# Patient Record
Sex: Male | Born: 2000 | Race: White | Hispanic: No | Marital: Single | Smoking: Current some day smoker
Health system: Southern US, Community
[De-identification: ages and names within clinical notes are randomized; demographics above are authoritative.]

## PROBLEM LIST (undated history)

## (undated) DIAGNOSIS — C801 Malignant (primary) neoplasm, unspecified: Secondary | ICD-10-CM

---
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
## 2019-04-09 ENCOUNTER — Other Ambulatory Visit: Payer: Self-pay

## 2019-04-09 ENCOUNTER — Emergency Department (HOSPITAL_COMMUNITY)
Admission: EM | Admit: 2019-04-09 | Discharge: 2019-04-09 | Disposition: A | Discharge status: Home / Self Care | Payer: Medicaid Other | Attending: Emergency Medicine | Admitting: Emergency Medicine

## 2019-04-09 ENCOUNTER — Encounter (HOSPITAL_COMMUNITY): Payer: Self-pay | Admitting: Emergency Medicine

## 2019-04-09 ENCOUNTER — Emergency Department (HOSPITAL_COMMUNITY): Payer: Medicaid Other

## 2019-04-09 DIAGNOSIS — K5900 Constipation, unspecified: Secondary | ICD-10-CM | POA: Diagnosis not present

## 2019-04-09 DIAGNOSIS — F17290 Nicotine dependence, other tobacco product, uncomplicated: Secondary | ICD-10-CM | POA: Insufficient documentation

## 2019-04-09 DIAGNOSIS — R1033 Periumbilical pain: Secondary | ICD-10-CM | POA: Insufficient documentation

## 2019-04-09 LAB — URINALYSIS, ROUTINE W REFLEX MICROSCOPIC
Bilirubin Urine: NEGATIVE
Glucose, UA: NEGATIVE mg/dL
Hgb urine dipstick: NEGATIVE
Ketones, ur: NEGATIVE mg/dL
Leukocytes,Ua: NEGATIVE
Nitrite: NEGATIVE
Protein, ur: NEGATIVE mg/dL
Specific Gravity, Urine: >1.03 — ABNORMAL HIGH (ref 1.005–1.030)
pH: 6 (ref 5.0–8.0)

## 2019-04-09 LAB — CBC
HCT: 44.9 % (ref 39.0–52.0)
Hemoglobin: 14.9 g/dL (ref 13.0–17.0)
MCH: 28.8 pg (ref 26.0–34.0)
MCHC: 33.2 g/dL (ref 30.0–36.0)
MCV: 86.8 fL (ref 80.0–100.0)
Platelets: 177 10*3/uL (ref 150–400)
RBC: 5.17 MIL/uL (ref 4.22–5.81)
RDW: 12.1 % (ref 11.5–15.5)
WBC: 4.2 10*3/uL (ref 4.0–10.5)
nRBC: 0 % (ref 0.0–0.2)

## 2019-04-09 LAB — COMPREHENSIVE METABOLIC PANEL
ALT: 12 U/L (ref 0–44)
AST: 20 U/L (ref 15–41)
Albumin: 4.4 g/dL (ref 3.5–5.0)
Alkaline Phosphatase: 76 U/L (ref 38–126)
Anion gap: 12 (ref 5–15)
BUN: 7 mg/dL (ref 6–20)
CO2: 24 mmol/L (ref 22–32)
Calcium: 9.6 mg/dL (ref 8.9–10.3)
Chloride: 104 mmol/L (ref 98–111)
Creatinine, Ser: 0.87 mg/dL (ref 0.61–1.24)
GFR calc Af Amer: >60 mL/min (ref >60)
GFR calc non Af Amer: >60 mL/min (ref >60)
Glucose, Bld: 97 mg/dL (ref 70–99)
Potassium: 4 mmol/L (ref 3.5–5.1)
Sodium: 140 mmol/L (ref 135–145)
Total Bilirubin: 0.8 mg/dL (ref 0.3–1.2)
Total Protein: 6.5 g/dL (ref 6.5–8.1)

## 2019-04-09 LAB — LIPASE, BLOOD: Lipase: 20 U/L (ref 11–51)

## 2019-04-09 MED ORDER — MORPHINE SULFATE (PF) 4 MG/ML IV SOLN
4.0000 mg | Freq: Once | INTRAVENOUS | Status: AC
  Administered 2019-04-09: 4 mg via INTRAVENOUS
  Filled 2019-04-09: qty 1

## 2019-04-09 MED ORDER — IOHEXOL 300 MG/ML  SOLN
100.0000 mL | Freq: Once | INTRAMUSCULAR | Status: AC | PRN
  Administered 2019-04-09: 100 mL via INTRAVENOUS

## 2019-04-09 MED ORDER — SODIUM CHLORIDE 0.9 % IV BOLUS
1000.0000 mL | Freq: Once | INTRAVENOUS | Status: AC
  Administered 2019-04-09: 1000 mL via INTRAVENOUS

## 2019-04-09 MED ORDER — ONDANSETRON HCL 4 MG/2ML IJ SOLN
4.0000 mg | Freq: Once | INTRAMUSCULAR | Status: AC
  Administered 2019-04-09: 4 mg via INTRAVENOUS
  Filled 2019-04-09: qty 2

## 2019-04-09 MED ORDER — SODIUM CHLORIDE 0.9% FLUSH
3.0000 mL | Freq: Once | INTRAVENOUS | Status: AC
  Administered 2019-04-09: 3 mL via INTRAVENOUS

## 2019-04-09 NOTE — Discharge Instructions (Addendum)
Your CT today did not show any appendicitis.   Suspect your pain is being caused due to constipation, please purchase Miralax over the counter and drink 1 capful in 8 oz of water for the next few days until you produce a bowel movement.   Follow up with your primary care physician .

## 2019-04-09 NOTE — ED Triage Notes (Signed)
Pt reports 1 week hx of periumbilical abdominal pain. Denies recent sick contacts. Medical problems. Fever. Reports recent emesis, constipation.

## 2019-04-09 NOTE — ED Provider Notes (Signed)
[facility] EMERGENCY DEPARTMENT Provider Note   CSN: [id number] Arrival date & time: 04/09/19  9030     History Chief Complaint  Patient presents with  . Abdominal Pain    Nicholas Dunn is a 19 y.o. male.  19 y.o male with a PMH of Bipolar presents to the ED with a chief complaint of abdominal pain x < 1 week. Patient describes an ache, dull pain on the periumbilical area, worse with attempting to defecate. Pain is alleviated with applying pressure around his umbilical region. He does endorse mild constipation reporting his last bowel movement was 4 days ago, no blood in his stool. He has not tried medication for improvement for his symptoms. He denies any alcohol use, drug use or THC use. No prior surgical history to his abdomen, fever, N/V/D. No recent travel or sick conctatcs.   The history is provided by the patient.  Abdominal Pain Pain location:  Periumbilical Associated symptoms: constipation   Associated symptoms: no chest pain, no diarrhea, no dysuria, no fever, no nausea, no shortness of breath, no sore throat and no vomiting         Family History  Problem Relation Age of Onset  . Healthy Mother   . Healthy Father     Social History   Tobacco Use  . Smoking status: Current Some Day Smoker    Types: Cigars  . Smokeless tobacco: Never Used  Substance Use Topics  . Alcohol use: Not Currently  . Drug use: Not Currently    Home Medications Prior to Admission medications   Medication Sig Start Date End Date Taking? Authorizing Provider  albuterol (VENTOLIN HFA) 108 (90 Base) MCG/ACT inhaler Inhale 1-2 puffs into the lungs every 6 (six) hours as needed for wheezing or shortness of breath. 12/18/18   Mickie Bail, NP  benzonatate (TESSALON) 100 MG capsule Take 1 capsule (100 mg total) by mouth every 8 (eight) hours. 09/14/18   Janace Aris, NP    Allergies    Patient has no known allergies.  Review of Systems   Review of Systems   Constitutional: Negative for fever.  HENT: Negative for sore throat.   Respiratory: Negative for shortness of breath.   Cardiovascular: Negative for chest pain.  Gastrointestinal: Positive for abdominal pain and constipation. Negative for blood in stool, diarrhea, nausea and vomiting.  Genitourinary: Negative for dysuria, flank pain, scrotal swelling and testicular pain.  Musculoskeletal: Negative for back pain.  Skin: Negative for pallor and wound.  Neurological: Negative for light-headedness and headaches.    Physical Exam Updated Vital Signs BP 115/62   Pulse (!) 49   Temp 97.9 F (36.6 C) (Oral)   Resp 20   Ht 6' (1.829 m)   Wt 59 kg   SpO2 100%   BMI 17.63 kg/m   Physical Exam Vitals and nursing note reviewed.  Constitutional:      Appearance: He is well-developed.  HENT:     Head: Normocephalic and atraumatic.  Cardiovascular:     Rate and Rhythm: Normal rate.  Pulmonary:     Effort: Pulmonary effort is normal.     Breath sounds: No wheezing or rales.     Comments: Lungs are cleared to auscultation, without wheezing, rales or rhonchi.  Abdominal:     General: Abdomen is flat. Bowel sounds are normal. There is no distension. There are no signs of injury.     Palpations: Abdomen is rigid. There is no hepatomegaly or splenomegaly.  Tenderness: There is generalized abdominal tenderness and tenderness in the periumbilical area and suprapubic area. There is guarding and rebound. There is no right CVA tenderness or left CVA tenderness. Positive signs include obturator sign. Negative signs include Murphy's sign.     Hernia: No hernia is present.  Skin:    General: Skin is warm and dry.  Neurological:     Mental Status: He is alert and oriented to person, place, and time.     ED Results / Procedures / Treatments   Labs (all labs ordered are listed, but only abnormal results are displayed) Labs Reviewed  URINALYSIS, ROUTINE W REFLEX MICROSCOPIC - Abnormal; Notable  for the following components:      Result Value   Specific Gravity, Urine >1.030 (*)    All other components within normal limits  LIPASE, BLOOD  COMPREHENSIVE METABOLIC PANEL  CBC    EKG None  Radiology CT ABDOMEN PELVIS W CONTRAST  Result Date: 04/09/2019 CLINICAL DATA:  Periumbilical abdominal pain for the past week. Nausea and vomiting. EXAM: CT ABDOMEN AND PELVIS WITH CONTRAST TECHNIQUE: Multidetector CT imaging of the abdomen and pelvis was performed using the standard protocol following bolus administration of intravenous contrast. CONTRAST:  122mL OMNIPAQUE IOHEXOL 300 MG/ML  SOLN COMPARISON:  None. FINDINGS: Lower chest: No acute abnormality. Hepatobiliary: No focal liver abnormality is seen. No gallstones, gallbladder wall thickening, or biliary dilatation. Pancreas: Unremarkable. No pancreatic ductal dilatation or surrounding inflammatory changes. Spleen: Normal in size without focal abnormality. Adrenals/Urinary Tract: Adrenal glands are unremarkable. Kidneys are normal, without renal calculi, focal lesion, or hydronephrosis. Bladder is unremarkable for the degree of distention. Stomach/Bowel: Stomach is within normal limits. Appendix appears normal. No evidence of bowel wall thickening, distention, or inflammatory changes. 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 No enlarged abdominal or pelvic lymph nodes. Reproductive: Prostate is unremarkable. Other: Trace free fluid in the pelvis. No pneumoperitoneum. Musculoskeletal: No acute or significant osseous findings. IMPRESSION: 1. No acute intra-abdominal process. Normal appendix. 2. Trace free fluid in the pelvis, nonspecific. 3. 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 Electronically Signed   By: Titus Dubin M.D.   On: 04/09/2019 10:41    Procedures Procedures (including critical care time)  Medications Ordered in  ED Medications  sodium chloride flush (NS) 0.9 % injection 3 mL (3 mLs Intravenous Given 04/09/19 0904)  morphine 4 MG/ML injection 4 mg (4 mg Intravenous Given 04/09/19 0852)  ondansetron (ZOFRAN) injection 4 mg (4 mg Intravenous Given 04/09/19 0852)  sodium chloride 0.9 % bolus 1,000 mL (0 mLs Intravenous Stopped 04/09/19 1211)  iohexol (OMNIPAQUE) 300 MG/ML solution 100 mL (100 mLs Intravenous Contrast Given 04/09/19 1020)  morphine 4 MG/ML injection 4 mg (4 mg Intravenous Given 04/09/19 1147)    ED Course  I have reviewed the triage vital signs and the nursing notes.  Pertinent labs & imaging results that were available during my care of the patient were reviewed by me and considered in my medical decision making (see chart for details).    MDM Rules/Calculators/A&P   Patient with a past medical history aside from bipolar presents to the ED with complaints of umbilical pain for the past 1 week.  Describing it as a dull aching pain to the periumbilical area alleviated with pressure on it.  Does report he is somewhat constipated with the last bowel movement 4 days ago.  No  prior surgical history, no alcohol use, drug use, THC use.  No fevers or recent suspicious food intake.  During my evaluation patient appears very uncomfortable, heart rate slightly elevated at 90, he has not taken any medication for improvement in symptoms.  Abdomen is somewhat rigid, he is voluntarily guarding, does have a positive obturator sign, bowel sounds are present, no visualized hernia.  Some suspicion for appendicitis versus constipation.  Given Zofran, morphine, bolus for symptomatic control.  He denies any testicular swelling, ocular pain, penile discharge, lower suspicion for GU involvement.   CT abdomen: 1. No acute intra-abdominal process. Normal appendix.  2. Trace free fluid in the pelvis, nonspecific.  3. 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     12:41 PM Patient's pain is controlled, did receive 2 doses of morphine which helped his symptoms.  He is requesting food at this time, will p.o. challenge, with dispo home   Portions of this note were generated with Dragon dictation software. Dictation errors may occur despite best attempts at proofreading.  Final Clinical Impression(s) / ED Diagnoses Final diagnoses:  Periumbilical abdominal pain    Rx / DC Orders ED Discharge Orders    None       Claude Manges, PA-C 04/09/19 1251    Gwyneth Sprout, MD 04/09/19 [phone]

## 2019-04-09 NOTE — ED Notes (Signed)
Pt tolerating PO intake well.

## 2019-07-04 ENCOUNTER — Other Ambulatory Visit: Payer: Self-pay

## 2019-07-04 ENCOUNTER — Ambulatory Visit (HOSPITAL_COMMUNITY)
Admission: EM | Admit: 2019-07-04 | Discharge: 2019-07-04 | Disposition: A | Discharge status: Home / Self Care | Payer: Medicaid Other | Attending: Family Medicine | Admitting: Family Medicine

## 2019-07-04 ENCOUNTER — Encounter (HOSPITAL_COMMUNITY): Payer: Self-pay

## 2019-07-04 DIAGNOSIS — L259 Unspecified contact dermatitis, unspecified cause: Secondary | ICD-10-CM | POA: Diagnosis not present

## 2019-07-04 MED ORDER — PREDNISONE 10 MG (21) PO TBPK: ORAL_TABLET | Freq: Every day | ORAL | 0 refills | Status: DC

## 2019-07-04 NOTE — ED Triage Notes (Signed)
Pt reports itchy rash in arms x 5 days after he was working in the garden.  Pt states the rash can be insect bites. Pt have no tried any medication.

## 2019-07-07 NOTE — ED Provider Notes (Signed)
  [facility]   [id number] 07/04/19 Arrival Time: 1857  ASSESSMENT & PLAN:  1. Contact dermatitis, unspecified contact dermatitis type, unspecified trigger     Possibly rhus. Discussed. No sign of skin infection.  Meds ordered this encounter  Medications  . predniSONE (STERAPRED UNI-PAK 21 TAB) 10 MG (21) TBPK tablet    Sig: Take by mouth daily. Take as directed.    Dispense:  21 tablet    Refill:  0    Benadryl if needed.  Will follow up with PCP or here if worsening or failing to improve as anticipated. Reviewed expectations re: course of current medical issues. Questions answered. Outlined signs and symptoms indicating need for more acute intervention. Patient verbalized understanding. After Visit Summary given.   SUBJECTIVE:  Nicholas Dunn is a 19 y.o. male who presents with a skin complaint. Rash; arms; bilat; noted after working in garden; itching; afebrile; no pain. No specific aggravating or alleviating factors reported.     OBJECTIVE: Vitals:   07/04/19 2012  BP: 112/68  Pulse: (!) 55  Resp: 16  Temp: 98.2 F (36.8 C)  TempSrc: Oral  SpO2: 100%    General appearance: alert; no distress HEENT: [place]; AT Neck: supple with FROM Lungs: clear to auscultation bilaterally Heart: regular rate and rhythm Extremities: no edema; moves all extremities normally Skin: warm and dry; areas of linear papules and vesicles with surrounding erythema over bilateral UE Psychological: alert and cooperative; normal mood and affect  No Known Allergies  History reviewed. No pertinent past medical history. Social History   Socioeconomic History  . Marital status: Single    Spouse name: Not on file  . Number of children: Not on file  . Years of education: Not on file  . Highest education level: Not on file  Occupational History  . Not on file  Tobacco Use  . Smoking status: Current Some Day Smoker    Types: Cigars  . Smokeless tobacco: Never Used  Substance  and Sexual Activity  . Alcohol use: Not Currently  . Drug use: Not Currently  . Sexual activity: Not on file  Other Topics Concern  . Not on file  Social History Narrative  . Not on file   Social Determinants of Health   Financial Resource Strain:   . Difficulty of Paying Living Expenses:   Food Insecurity:   . Worried About Programme researcher, broadcasting/film/video in the Last Year:   . Barista in the Last Year:   Transportation Needs:   . Freight forwarder (Medical):   Marland Kitchen Lack of Transportation (Non-Medical):   Physical Activity:   . Days of Exercise per Week:   . Minutes of Exercise per Session:   Stress:   . Feeling of Stress :   Social Connections:   . Frequency of Communication with Friends and Family:   . Frequency of Social Gatherings with Friends and Family:   . Attends Religious Services:   . Active Member of Clubs or Organizations:   . Attends Banker Meetings:   Marland Kitchen Marital Status:   Intimate Partner Violence:   . Fear of Current or Ex-Partner:   . Emotionally Abused:   Marland Kitchen Physically Abused:   . Sexually Abused:    Family History  Problem Relation Age of Onset  . Healthy Mother   . Healthy Father    History reviewed. No pertinent surgical history.   Mardella Layman, MD 07/07/19 1002

## 2019-10-24 ENCOUNTER — Emergency Department (HOSPITAL_COMMUNITY)
Admission: EM | Admit: 2019-10-24 | Discharge: 2019-10-25 | Disposition: A | Discharge status: Home / Self Care | Payer: Medicaid Other | Attending: Emergency Medicine | Admitting: Emergency Medicine

## 2019-10-24 ENCOUNTER — Encounter (HOSPITAL_COMMUNITY): Payer: Self-pay | Admitting: Emergency Medicine

## 2019-10-24 ENCOUNTER — Other Ambulatory Visit: Payer: Self-pay

## 2019-10-24 DIAGNOSIS — Z5321 Procedure and treatment not carried out due to patient leaving prior to being seen by health care provider: Secondary | ICD-10-CM | POA: Insufficient documentation

## 2019-10-24 DIAGNOSIS — R197 Diarrhea, unspecified: Secondary | ICD-10-CM | POA: Diagnosis present

## 2019-10-24 DIAGNOSIS — R1030 Lower abdominal pain, unspecified: Secondary | ICD-10-CM | POA: Insufficient documentation

## 2019-10-24 LAB — COMPREHENSIVE METABOLIC PANEL
ALT: 16 U/L (ref 0–44)
AST: 25 U/L (ref 15–41)
Albumin: 3.7 g/dL (ref 3.5–5.0)
Alkaline Phosphatase: 66 U/L (ref 38–126)
Anion gap: 9 (ref 5–15)
BUN: 8 mg/dL (ref 6–20)
CO2: 25 mmol/L (ref 22–32)
Calcium: 9 mg/dL (ref 8.9–10.3)
Chloride: 104 mmol/L (ref 98–111)
Creatinine, Ser: 0.88 mg/dL (ref 0.61–1.24)
GFR calc Af Amer: >60 mL/min (ref >60)
GFR calc non Af Amer: >60 mL/min (ref >60)
Glucose, Bld: 86 mg/dL (ref 70–99)
Potassium: 4.1 mmol/L (ref 3.5–5.1)
Sodium: 138 mmol/L (ref 135–145)
Total Bilirubin: 0.7 mg/dL (ref 0.3–1.2)
Total Protein: 6.4 g/dL — ABNORMAL LOW (ref 6.5–8.1)

## 2019-10-24 LAB — CBC
HCT: 43.7 % (ref 39.0–52.0)
Hemoglobin: 14.3 g/dL (ref 13.0–17.0)
MCH: 28.7 pg (ref 26.0–34.0)
MCHC: 32.7 g/dL (ref 30.0–36.0)
MCV: 87.8 fL (ref 80.0–100.0)
Platelets: 208 10*3/uL (ref 150–400)
RBC: 4.98 MIL/uL (ref 4.22–5.81)
RDW: 12.1 % (ref 11.5–15.5)
WBC: 6.5 10*3/uL (ref 4.0–10.5)
nRBC: 0 % (ref 0.0–0.2)

## 2019-10-24 LAB — LIPASE, BLOOD: Lipase: 25 U/L (ref 11–51)

## 2019-10-24 NOTE — ED Triage Notes (Signed)
Patient arrives to ED with complaints of diarrhea since Friday and suprapubic abdominal pain. Pt states he has gone 7-8 times a day. Today, pt noticed bright red blood coming from his rectum and abdominal pain increasing. Pt states he got two turtles on the same day the diarrhea started, he was wondering if the turtles shell had bacteria in them that could make him ill.

## 2019-10-25 NOTE — ED Notes (Addendum)
Pt called multiple times for roll call with out response.

## 2020-03-26 ENCOUNTER — Encounter (HOSPITAL_COMMUNITY): Payer: Self-pay

## 2020-03-26 ENCOUNTER — Emergency Department (HOSPITAL_COMMUNITY)
Admission: EM | Admit: 2020-03-26 | Discharge: 2020-03-26 | Disposition: A | Discharge status: Home / Self Care | Payer: Medicaid Other | Attending: Emergency Medicine | Admitting: Emergency Medicine

## 2020-03-26 DIAGNOSIS — J029 Acute pharyngitis, unspecified: Secondary | ICD-10-CM

## 2020-03-26 DIAGNOSIS — R509 Fever, unspecified: Secondary | ICD-10-CM | POA: Diagnosis present

## 2020-03-26 DIAGNOSIS — F17290 Nicotine dependence, other tobacco product, uncomplicated: Secondary | ICD-10-CM | POA: Diagnosis not present

## 2020-03-26 DIAGNOSIS — J45909 Unspecified asthma, uncomplicated: Secondary | ICD-10-CM | POA: Diagnosis not present

## 2020-03-26 DIAGNOSIS — Z20822 Contact with and (suspected) exposure to covid-19: Secondary | ICD-10-CM | POA: Diagnosis not present

## 2020-03-26 LAB — GROUP A STREP BY PCR: Group A Strep by PCR: NOT DETECTED

## 2020-03-26 LAB — SARS CORONAVIRUS 2 (TAT 6-24 HRS): SARS Coronavirus 2: NEGATIVE

## 2020-03-26 MED ORDER — IBUPROFEN 400 MG PO TABS
600.0000 mg | ORAL_TABLET | Freq: Once | ORAL | Status: AC
  Administered 2020-03-26: 600 mg via ORAL
  Filled 2020-03-26: qty 1

## 2020-03-26 NOTE — ED Triage Notes (Signed)
Pt reports that he has had fevers at homes, headache and sore throat since yesterday, unvaccinated

## 2020-03-26 NOTE — ED Provider Notes (Signed)
[facility] EMERGENCY DEPARTMENT Provider Note   CSN: [id number] Arrival date & time: 03/26/20  5465     History Chief Complaint  Patient presents with  . Covid Exposure    EDUARD PENKALA is a 20 y.o. male.  HPI  Pt presenting with c/o fever, headache, sore throat- symptoms beginning last night.  He has no significant cough or dificulty breathing.  He has hx of asthma.  No difficulty swallowing.  Headache is frontal.  tmax 101 at home- he took tylenol and symptoms continued this morning.  No vomiting or abdominal pain.  He states hx of asthma- no problems with it in approx 1 year.  He states he does have albuterol inhaler at home.  No known covid exposures.  Has not had covid vaccination. There are no other associated systemic symptoms, there are no other alleviating or modifying factors.      History reviewed. No pertinent past medical history.  There are no problems to display for this patient.   History reviewed. No pertinent surgical history.     Family History  Problem Relation Age of Onset  . Healthy Mother   . Healthy Father     Social History   Tobacco Use  . Smoking status: Current Some Day Smoker    Types: Cigars  . Smokeless tobacco: Never Used  Vaping Use  . Vaping Use: Every day  . Substances: Nicotine, Flavoring  Substance Use Topics  . Alcohol use: Not Currently  . Drug use: Not Currently    Home Medications Prior to Admission medications   Medication Sig Start Date End Date Taking? Authorizing Provider  albuterol (VENTOLIN HFA) 108 (90 Base) MCG/ACT inhaler Inhale 1-2 puffs into the lungs every 6 (six) hours as needed for wheezing or shortness of breath. 12/18/18   Mickie Bail, NP  benzonatate (TESSALON) 100 MG capsule Take 1 capsule (100 mg total) by mouth every 8 (eight) hours. 09/14/18   Bast, Gloris Manchester A, NP  predniSONE (STERAPRED UNI-PAK 21 TAB) 10 MG (21) TBPK tablet Take by mouth daily. Take as directed. 07/04/19   Mardella Layman, MD    Allergies    Patient has no known allergies.  Review of Systems   Review of Systems  ROS reviewed and all otherwise negative except for mentioned in HPI  Physical Exam Updated Vital Signs BP 117/70   Pulse 66   Temp 98.2 F (36.8 C) (Oral)   Resp 15   SpO2 100%  Vitals reviewed Physical Exam  Physical Examination: GENERAL ASSESSMENT: active, alert, no acute distress, well hydrated, well nourished SKIN: no lesions, jaundice, petechiae, pallor, cyanosis, ecchymosis HEAD: Atraumatic, normocephalic MOUTH: mucous membranes moist, OP with moderate erythema, palate symmetric, uvula midline, no exudate NECK: supple, full range of motion, no mass, no sig LAD LUNGS: Respiratory effort normal, clear to auscultation, normal breath sounds bilaterally HEART: Regular rate and rhythm, normal S1/S2, no murmurs, normal pulses and brisk capillary fill EXTREMITY: Normal muscle tone. No swelling NEURO: normal tone, awake, alert, interactive  ED Results / Procedures / Treatments   Labs (all labs ordered are listed, but only abnormal results are displayed) Labs Reviewed  GROUP A STREP BY PCR  SARS CORONAVIRUS 2 (TAT 6-24 HRS)    EKG None  Radiology No results found.  Procedures Procedures   Medications Ordered in ED Medications  ibuprofen (ADVIL) tablet 600 mg (600 mg Oral Given 03/26/20 6812)    ED Course  I have reviewed the triage vital  signs and the nursing notes.  Pertinent labs & imaging results that were available during my care of the patient were reviewed by me and considered in my medical decision making (see chart for details).    MDM Rules/Calculators/A&P                          Pt presenting with c/o sore throat, headache, fever beginning yesterday.   Patient is overall nontoxic and well hydrated in appearance.  Strep testing negative, no signs of PTA.  Lungs clear with normal work of breathing, no hypoxia.  covid test pending.  Advised symptomatic  treatment at home.  Isolation until covid test result obtained.  Pt endorses he does have albuterol inhaler- no wheezing currently- for home use.  Pt discharged with strict return precautions.  Mom agreeable with plan  Final Clinical Impression(s) / ED Diagnoses Final diagnoses:  Viral pharyngitis    Rx / DC Orders ED Discharge Orders    None       Tayt Moyers, Latanya Maudlin, MD 03/26/20 1017

## 2020-03-26 NOTE — Discharge Instructions (Signed)
Return to the ED with any concerns including difficulty breathing, vomiting and not able to keep down liquids, decreased urine output, decreased level of alertness/lethargy, or any other alarming symptoms  °

## 2020-03-31 ENCOUNTER — Other Ambulatory Visit: Payer: Self-pay

## 2020-03-31 ENCOUNTER — Encounter (HOSPITAL_COMMUNITY): Payer: Self-pay | Admitting: Emergency Medicine

## 2020-03-31 ENCOUNTER — Ambulatory Visit (HOSPITAL_COMMUNITY)
Admission: EM | Admit: 2020-03-31 | Discharge: 2020-03-31 | Disposition: A | Discharge status: Home / Self Care | Payer: Medicaid Other | Attending: Emergency Medicine | Admitting: Emergency Medicine

## 2020-03-31 DIAGNOSIS — J069 Acute upper respiratory infection, unspecified: Secondary | ICD-10-CM

## 2020-03-31 MED ORDER — AEROCHAMBER PLUS MISC: 2 refills | Status: DC

## 2020-03-31 MED ORDER — FAMOTIDINE 20 MG PO TABS: 20.0000 mg | ORAL_TABLET | Freq: Two times a day (BID) | ORAL | 0 refills | Status: DC

## 2020-03-31 MED ORDER — BENZONATATE 200 MG PO CAPS: 200.0000 mg | ORAL_CAPSULE | Freq: Three times a day (TID) | ORAL | 0 refills | Status: DC | PRN

## 2020-03-31 MED ORDER — ALBUTEROL SULFATE HFA 108 (90 BASE) MCG/ACT IN AERS
1.0000 | INHALATION_SPRAY | RESPIRATORY_TRACT | 0 refills | Status: DC | PRN
Start: 2020-03-31 — End: 2021-05-15

## 2020-03-31 MED ORDER — FLUTICASONE PROPIONATE 50 MCG/ACT NA SUSP: 2.0000 | Freq: Every day | NASAL | 0 refills | Status: DC

## 2020-03-31 NOTE — ED Triage Notes (Addendum)
Patient started with cough on Thursday.  Patient reports going to ED for treatment.  Patient was given ibuprofen.  Patient has a cough and sore throat.  Denies fever  03/26/2020-strep and covid tested negative

## 2020-03-31 NOTE — Discharge Instructions (Addendum)
Tessalon for the cough.  Flonase, saline nasal irrigation with a Lloyd Huger Med rinse and distilled water as often as he wants.  Continue Mucinex D or Mucinex DM.  2 puffs from your albuterol inhaler using your spacer every 4-6 hours as needed.  You can also try some Pepcid in case your cough is being aggravated by acid reflux.  Follow-up with a primary care provider of your choice, see list below  Below is a list of primary care practices who are taking new patients for you to follow-up with.  [facility] internal medicine clinic Ground Floor - [facility], [address] [place], [place], [place] [address] [phone]  [facility] at [facility] [address] [place], [place] [address] [phone]  [facility] [address] [place], [place] [address] [phone]  [facility] [phone] [address] [place] [address]  [facility]: [address] [place] [address]  [phone]  [facility]: [address] [place] [address]   [phone]  [facility]: [address] [place] [address]  [phone]  [facility] : [address] [place] [address] [phone]  [facility] Primary Care: [address] [place] [address] [phone]  [facility]: [address] [place] [address] [phone]  Dr. Oneal Grout [address] [facility] [place] [facility] [place] [address]  [phone]  Dr. Jackie Plum, [facility]. [address] [place], [place] [address]  [phone]  Go to www.goodrx.com to look up your medications. This will give you a list of where you can find your prescriptions at the most affordable prices. Or ask  the pharmacist what the cash price is, or if they have any other discount programs available to help make your medication more affordable. This can be less expensive than what you would pay with insurance.

## 2020-03-31 NOTE — ED Provider Notes (Signed)
HPI  SUBJECTIVE:  Nicholas Dunn is a 20 y.o. male who presents with 5 days of cough occasionally productive of greenish mucus, sore throat, chest soreness secondary to the cough.  He denies fevers, nasal congestion, rhinorrhea, postnasal drip, wheezing, shortness of breath, dyspnea on exertion.  No sinus pain or pressure, nausea, vomiting, diarrhea.  He reports burning chest pain when he eats or drinks acidic foods.  He denies waterbrash or belching.  No allergy symptoms.  States he is unable to sleep at night due to the cough.  He is waking up coughing.  He has not tried his albuterol inhaler during this time.  No antibiotics in the past month.  No antipyretic in the past 6 hours.  He has tried 500 mg of Tylenol, Mucinex DM, ibuprofen and Chloraseptic spray.  The Chloraseptic and Mucinex helped.  Symptoms are worse with lying down.  He has a past medical history of asthma that he states is controlled with albuterol as needed.  No history of GERD, allergies, frequent sinusitis, smoking.  PMD: None.  Seen in the ED 5 days ago for fever, headache, sore throat.  Strep, COVID were negative.    History reviewed. No pertinent past medical history.  History reviewed. No pertinent surgical history.  Family History  Problem Relation Age of Onset  . Healthy Mother   . Healthy Father     Social History   Tobacco Use  . Smoking status: Current Some Day Smoker    Types: Cigars  . Smokeless tobacco: Never Used  Vaping Use  . Vaping Use: Every day  . Substances: Nicotine, Flavoring  Substance Use Topics  . Alcohol use: Not Currently  . Drug use: Not Currently    No current facility-administered medications for this encounter.  Current Outpatient Medications:  .  acetaminophen (TYLENOL) 325 MG tablet, Take 650 mg by mouth every 6 (six) hours as needed., Disp: , Rfl:  .  albuterol (VENTOLIN HFA) 108 (90 Base) MCG/ACT inhaler, Inhale 1-2 puffs into the lungs every 4 (four) hours as needed for  wheezing or shortness of breath., Disp: 1 each, Rfl: 0 .  benzonatate (TESSALON) 200 MG capsule, Take 1 capsule (200 mg total) by mouth 3 (three) times daily as needed for cough., Disp: 30 capsule, Rfl: 0 .  famotidine (PEPCID) 20 MG tablet, Take 1 tablet (20 mg total) by mouth 2 (two) times daily., Disp: 60 tablet, Rfl: 0 .  fluticasone (FLONASE) 50 MCG/ACT nasal spray, Place 2 sprays into both nostrils daily., Disp: 16 g, Rfl: 0 .  guaiFENesin (MUCINEX) 600 MG 12 hr tablet, Take by mouth 2 (two) times daily., Disp: , Rfl:  .  NON FORMULARY, Throat spray, Disp: , Rfl:  .  Spacer/Aero-Holding Chambers (AEROCHAMBER PLUS) inhaler, Use with inhaler, Disp: 1 each, Rfl: 2  No Known Allergies   ROS  As noted in HPI.   Physical Exam  BP (!) 147/84 (BP Location: Right Arm)   Pulse 86   Temp 98.4 F (36.9 C) (Oral)   Resp (!) 22   SpO2 96%   Constitutional: Well developed, well nourished, no acute distress.  Coughing. Eyes:  EOMI, conjunctiva normal bilaterally HENT: Normocephalic, atraumatic,mucus membranes moist. positive nasal congestion.  No maxillary, frontal sinus tenderness.  Positive cobblestoning and postnasal drip Respiratory: Normal inspiratory effort, lungs clear bilaterally.  No anterior, lateral chest wall tenderness Cardiovascular: Normal rate, regular rhythm no murmurs rubs or gallops GI: nondistended skin: No rash, skin intact Musculoskeletal: no deformities Neurologic:  Alert & oriented x 3, no focal neuro deficits Psychiatric: Speech and behavior appropriate   ED Course   Medications - No data to display  No orders of the defined types were placed in this encounter.   No results found for this or any previous visit (from the past 24 hour(s)). No results found.  ED Clinical Impression  1. Viral URI with cough      ED Assessment/Plan  ER notes reviewed.  Notes noted in HPI.  Suspect the cough to be coming from a URI/postnasal drip.  Could also be a mild  asthma exacerbation, however denies wheezing, shortness of breath, shortness of breath with exertion. he is also reporting symptoms that are suspicious for acid reflux which could be contributing to his cough.  His lungs are clear, afebrile, no antipyretic in the past 6 hours, pneumonia lower in the differential.  No evidence of sinusitis.  Will send home with Tessalon, Flonase, saline nasal irrigation, continue Mucinex DM.  Will prescribe an albuterol inhaler with a spacer.  He is to do 2 puffs every 4 hours as needed.  Also will start him on some Pepcid.  Will provide primary care list for ongoing care, and order primary care referral.   Discussed  MDM, treatment plan, and plan for follow-up with patient. Discussed sn/sx that should prompt return to the ED. patient agrees with plan.   Meds ordered this encounter  Medications  . albuterol (VENTOLIN HFA) 108 (90 Base) MCG/ACT inhaler    Sig: Inhale 1-2 puffs into the lungs every 4 (four) hours as needed for wheezing or shortness of breath.    Dispense:  1 each    Refill:  0  . fluticasone (FLONASE) 50 MCG/ACT nasal spray    Sig: Place 2 sprays into both nostrils daily.    Dispense:  16 g    Refill:  0  . Spacer/Aero-Holding Chambers (AEROCHAMBER PLUS) inhaler    Sig: Use with inhaler    Dispense:  1 each    Refill:  2    Please educate patient on use  . benzonatate (TESSALON) 200 MG capsule    Sig: Take 1 capsule (200 mg total) by mouth 3 (three) times daily as needed for cough.    Dispense:  30 capsule    Refill:  0  . famotidine (PEPCID) 20 MG tablet    Sig: Take 1 tablet (20 mg total) by mouth 2 (two) times daily.    Dispense:  60 tablet    Refill:  0    *This clinic note was created using Scientist, clinical (histocompatibility and immunogenetics). Therefore, there may be occasional mistakes despite careful proofreading.   ?    Domenick Gong, MD 03/31/20 [phone]

## 2020-11-30 ENCOUNTER — Emergency Department
Admission: EM | Admit: 2020-11-30 | Discharge: 2020-12-01 | Disposition: A | Discharge status: Home / Self Care | Payer: Medicaid Other | Attending: Emergency Medicine | Admitting: Emergency Medicine

## 2020-11-30 ENCOUNTER — Other Ambulatory Visit: Payer: Self-pay

## 2020-11-30 DIAGNOSIS — R45851 Suicidal ideations: Secondary | ICD-10-CM | POA: Diagnosis not present

## 2020-11-30 DIAGNOSIS — F4323 Adjustment disorder with mixed anxiety and depressed mood: Secondary | ICD-10-CM

## 2020-11-30 DIAGNOSIS — Z20822 Contact with and (suspected) exposure to covid-19: Secondary | ICD-10-CM | POA: Diagnosis not present

## 2020-11-30 DIAGNOSIS — Z046 Encounter for general psychiatric examination, requested by authority: Secondary | ICD-10-CM | POA: Diagnosis not present

## 2020-11-30 DIAGNOSIS — F17290 Nicotine dependence, other tobacco product, uncomplicated: Secondary | ICD-10-CM | POA: Diagnosis not present

## 2020-11-30 DIAGNOSIS — Y900 Blood alcohol level of less than 20 mg/100 ml: Secondary | ICD-10-CM | POA: Insufficient documentation

## 2020-11-30 LAB — CBC
HCT: 42.7 % (ref 39.0–52.0)
Hemoglobin: 14.8 g/dL (ref 13.0–17.0)
MCH: 29.9 pg (ref 26.0–34.0)
MCHC: 34.7 g/dL (ref 30.0–36.0)
MCV: 86.3 fL (ref 80.0–100.0)
Platelets: 194 10*3/uL (ref 150–400)
RBC: 4.95 MIL/uL (ref 4.22–5.81)
RDW: 12.3 % (ref 11.5–15.5)
WBC: 7.7 10*3/uL (ref 4.0–10.5)
nRBC: 0 % (ref 0.0–0.2)

## 2020-11-30 LAB — RESP PANEL BY RT-PCR (FLU A&B, COVID) ARPGX2
Influenza A by PCR: NEGATIVE
Influenza B by PCR: NEGATIVE
SARS Coronavirus 2 by RT PCR: NEGATIVE

## 2020-11-30 LAB — ETHANOL: Alcohol, Ethyl (B): <10 mg/dL (ref <10)

## 2020-11-30 LAB — COMPREHENSIVE METABOLIC PANEL
ALT: 15 U/L (ref 0–44)
AST: 26 U/L (ref 15–41)
Albumin: 4.5 g/dL (ref 3.5–5.0)
Alkaline Phosphatase: 75 U/L (ref 38–126)
Anion gap: 8 (ref 5–15)
BUN: 15 mg/dL (ref 6–20)
CO2: 27 mmol/L (ref 22–32)
Calcium: 9.4 mg/dL (ref 8.9–10.3)
Chloride: 103 mmol/L (ref 98–111)
Creatinine, Ser: 0.94 mg/dL (ref 0.61–1.24)
GFR, Estimated: >60 mL/min (ref >60)
Glucose, Bld: 112 mg/dL — ABNORMAL HIGH (ref 70–99)
Potassium: 3.8 mmol/L (ref 3.5–5.1)
Sodium: 138 mmol/L (ref 135–145)
Total Bilirubin: 0.9 mg/dL (ref 0.3–1.2)
Total Protein: 6.8 g/dL (ref 6.5–8.1)

## 2020-11-30 LAB — SALICYLATE LEVEL: Salicylate Lvl: <7 mg/dL — ABNORMAL LOW (ref 7.0–30.0)

## 2020-11-30 LAB — ACETAMINOPHEN LEVEL: Acetaminophen (Tylenol), Serum: <10 ug/mL — ABNORMAL LOW (ref 10–30)

## 2020-11-30 MED ORDER — ALBUTEROL SULFATE HFA 108 (90 BASE) MCG/ACT IN AERS
1.0000 | INHALATION_SPRAY | RESPIRATORY_TRACT | Status: DC | PRN
  Filled 2020-11-30: qty 6.7

## 2020-11-30 MED ORDER — FLUTICASONE PROPIONATE 50 MCG/ACT NA SUSP
2.0000 | Freq: Every day | NASAL | Status: DC
  Filled 2020-11-30: qty 16

## 2020-11-30 MED ORDER — FAMOTIDINE 20 MG PO TABS
20.0000 mg | ORAL_TABLET | Freq: Two times a day (BID) | ORAL | Status: DC
  Filled 2020-11-30: qty 1

## 2020-11-30 MED ORDER — NICOTINE 21 MG/24HR TD PT24
21.0000 mg | MEDICATED_PATCH | Freq: Every day | TRANSDERMAL | Status: DC
  Administered 2020-12-01: 21 mg via TRANSDERMAL
  Filled 2020-11-30: qty 1

## 2020-11-30 NOTE — ED Notes (Signed)
Report to include Situation, Background, Assessment, and Recommendations received from Dawn RN. Patient alert and oriented, warm and dry, in no acute distress. Patient denies SI, HI, AVH and pain. Patient made aware of Q15 minute rounds and security cameras for their safety. Patient instructed to come to me with needs or concerns.  

## 2020-11-30 NOTE — ED Triage Notes (Signed)
Pt states is suicidal. Presents voluntary. Pt states has felt this way for a day. Pt is cooperative in triage.

## 2020-11-30 NOTE — ED Notes (Signed)
Hourly rounding reveals patient in room. No complaints, stable, in no acute distress. Q15 minute rounds and monitoring via Security Cameras to continue. 

## 2020-11-30 NOTE — ED Notes (Signed)
Handcuff's removed as soon as patient placed into room 23.  Patient now calm and cooperative.

## 2020-11-30 NOTE — ED Notes (Addendum)
Patient ran into lobby, [facility] security bringing patient back to room 23.  Patient now in handcuff restraints by [facility] for patient's safety.  Dr. Fanny Bien in to speak with patient.

## 2020-11-30 NOTE — ED Notes (Signed)
ED Provider at bedside. 
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
## 2020-12-01 DIAGNOSIS — F4323 Adjustment disorder with mixed anxiety and depressed mood: Secondary | ICD-10-CM

## 2020-12-01 LAB — URINE DRUG SCREEN, QUALITATIVE (ARMC ONLY)
Amphetamines, Ur Screen: NOT DETECTED
Barbiturates, Ur Screen: NOT DETECTED
Benzodiazepine, Ur Scrn: NOT DETECTED
Cannabinoid 50 Ng, Ur ~~LOC~~: NOT DETECTED
Cocaine Metabolite,Ur ~~LOC~~: NOT DETECTED
MDMA (Ecstasy)Ur Screen: NOT DETECTED
Methadone Scn, Ur: NOT DETECTED
Opiate, Ur Screen: NOT DETECTED
Phencyclidine (PCP) Ur S: NOT DETECTED
Tricyclic, Ur Screen: NOT DETECTED

## 2020-12-01 MED ORDER — ONDANSETRON 4 MG PO TBDP
4.0000 mg | ORAL_TABLET | Freq: Once | ORAL | Status: AC | PRN
  Administered 2020-12-01: 4 mg via ORAL
  Filled 2020-12-01 (×2): qty 1

## 2020-12-01 MED ORDER — ONDANSETRON 4 MG PO TBDP
ORAL_TABLET | ORAL | Status: AC
  Administered 2020-12-01: 4 mg via ORAL
  Filled 2020-12-01: qty 1

## 2020-12-01 MED ORDER — LORAZEPAM 1 MG PO TABS
1.0000 mg | ORAL_TABLET | Freq: Once | ORAL | Status: AC
  Administered 2020-12-01: 1 mg via ORAL
  Filled 2020-12-01: qty 1

## 2020-12-01 MED ORDER — ONDANSETRON 4 MG PO TBDP
4.0000 mg | ORAL_TABLET | Freq: Once | ORAL | Status: AC
  Filled 2020-12-01: qty 1

## 2020-12-01 MED ORDER — ACETAMINOPHEN 325 MG PO TABS
650.0000 mg | ORAL_TABLET | Freq: Four times a day (QID) | ORAL | Status: DC | PRN
  Administered 2020-12-01: 650 mg via ORAL
  Filled 2020-12-01: qty 2

## 2020-12-01 NOTE — ED Notes (Signed)
Hourly rounding reveals patient in room. No complaints, stable, in no acute distress. Q15 minute rounds and monitoring via Security Cameras to continue. 

## 2020-12-01 NOTE — ED Notes (Signed)
VS not taken, patient asleep 

## 2020-12-01 NOTE — ED Notes (Signed)
Water and crackers were given.

## 2020-12-01 NOTE — ED Notes (Signed)
IVC/pending psych consult 

## 2020-12-01 NOTE — ED Notes (Signed)
Pt given zofran and ginger ale. Temperature rechecked. Pt now c/o HA. MD made aware.

## 2020-12-01 NOTE — ED Notes (Signed)
Pt given belongings post discharge instructions.  

## 2020-12-01 NOTE — ED Notes (Signed)
Snack and drink offered. Pt continues to feel nauseas.

## 2020-12-01 NOTE — ED Notes (Signed)
Patient vomited x2. Zofran ordered as protocol order. Dr. Roxan Hockey notified. Will repeat temp to verify no fever is present.

## 2020-12-01 NOTE — ED Provider Notes (Signed)
The patient has been evaluated at bedside by Dr. Toni Amend, psychiatry.  Patient is clinically stable.  Not felt to be a danger to self or others.  No SI or Hi.  No indication for inpatient psychiatric admission at this time.  Appropriate for continued outpatient therapy.    Willy Eddy, MD 12/01/20 [phone]

## 2020-12-01 NOTE — ED Notes (Addendum)
Lunch tray offered. Pt still feel nauseas. Water was given.

## 2020-12-01 NOTE — ED Notes (Signed)
Phone given. 

## 2020-12-01 NOTE — ED Notes (Signed)
Pt is asleep. VS will be assess when pt is awake. No other needs found at this moment. 

## 2020-12-01 NOTE — ED Notes (Signed)
Pt requested to provide visitor with his car key. This tech provided visitor with pt car key. Victorino Dike, RN aware.

## 2020-12-01 NOTE — ED Notes (Signed)
Patient now c/o HA as well. Awaiting Tylenol to be verified by pharmacy to give due to hard stop in [facility].

## 2020-12-01 NOTE — ED Notes (Signed)
Patient vomited again. Denies abd pain or dizziness. Dr. Roxan Hockey notified, Zofran given.

## 2020-12-01 NOTE — ED Notes (Addendum)
Pt in bathroom throwing up in the bathroom and endorses diarrhea. Full set of vitals taken at this time

## 2020-12-01 NOTE — ED Provider Notes (Signed)
Emergency Medicine Observation Re-evaluation Note  Nicholas Dunn is a 20 y.o. male, seen on rounds today.  Pt initially presented to the ED for complaints of Suicidal Currently, the patient is resting.  Physical Exam  BP 133/79   Pulse 70   Resp 16   Ht 6' (1.829 m)   Wt 63.5 kg   SpO2 96%   BMI 18.99 kg/m  Physical Exam General: NAD Cardiac: well perfused Lungs: unlabored Psych: currently calm  ED Course / MDM  EKG:   I have reviewed the labs performed to date as well as medications administered while in observation.  Recent changes in the last 24 hours include none.  Plan  Current plan is for psych eval. Nicholas Dunn is under involuntary commitment.      Willy Eddy, MD 12/01/20 [phone]

## 2020-12-01 NOTE — ED Notes (Signed)
Pt has a visitor. This staff remains outside the room.
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
## 2020-12-01 NOTE — ED Notes (Signed)
Breakfast tray given. VS obtained. Shower offered. No other needs found at this moment. 

## 2021-02-08 IMAGING — CT CT ABD-PELV W/ CM
2 of 4 series · 16 of 46 positions shown, 18 images · IV contrast (omnipaque)
Comparison: None.

CLINICAL DATA: Periumbilical abdominal pain for the past week.
Nausea and vomiting.

EXAM:
CT ABDOMEN AND PELVIS WITH CONTRAST
TECHNIQUE: Multidetector CT imaging of the abdomen and pelvis was performed
using the standard protocol following bolus administration of
intravenous contrast.
CONTRAST:  100mL OMNIPAQUE IOHEXOL 300 MG/ML  SOLN

[Series 3: abd/ pelvis 5.0 i30f 2 · axial · 0.62mm/px · z∈[+767,+1167]mm · 13 of 88 slices shown, 15 images]
[im 4/88  soft-tissue]
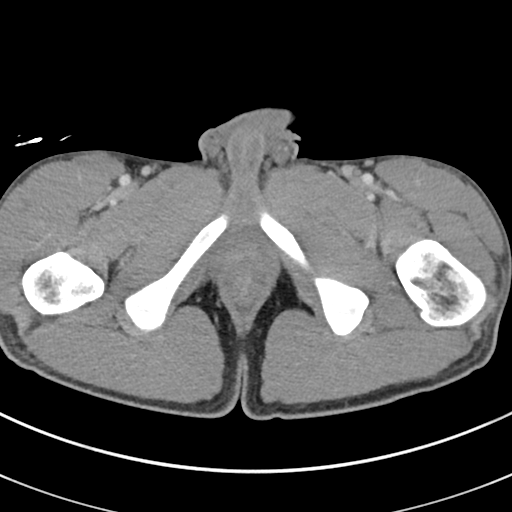
[im 4/88  bone]
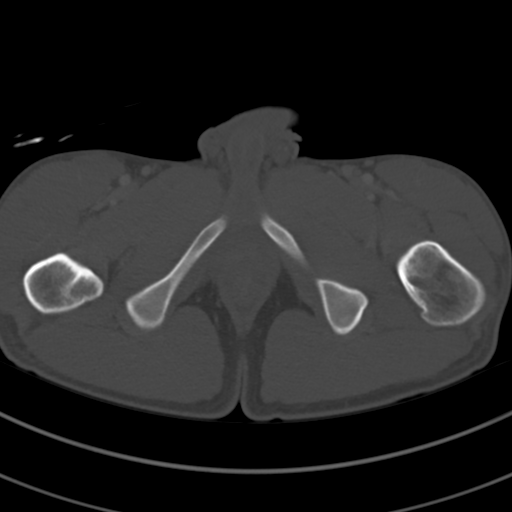
[im 11/88  soft-tissue]
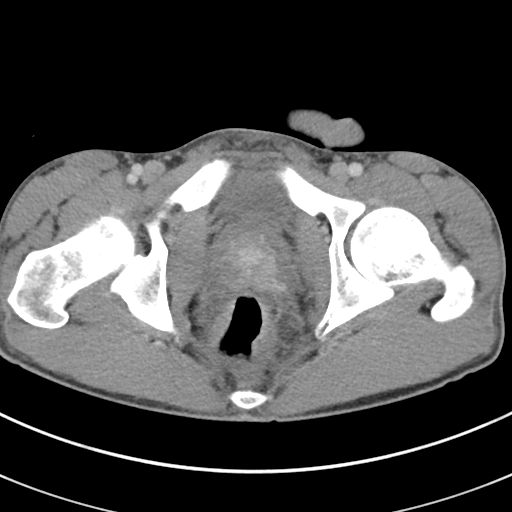
[im 18/88  soft-tissue]
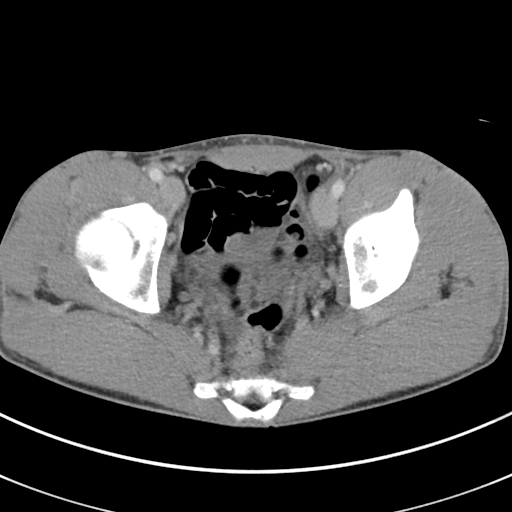
[im 25/88  soft-tissue]
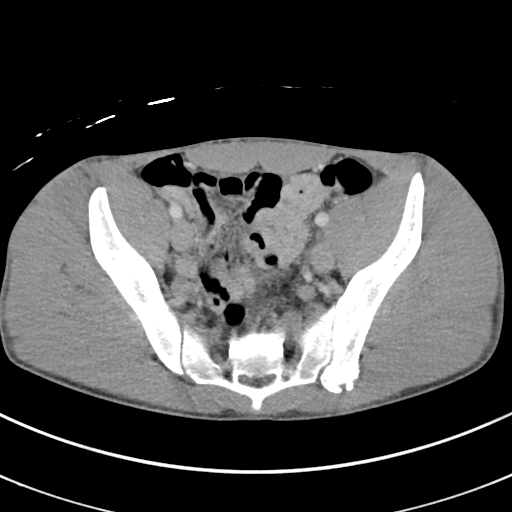
[im 32/88  soft-tissue]
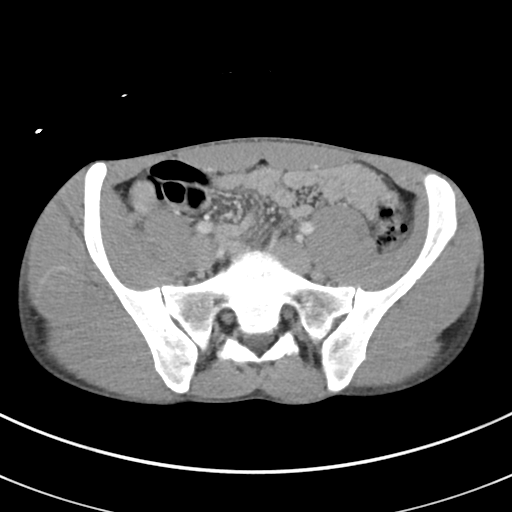
[im 39/88  soft-tissue]
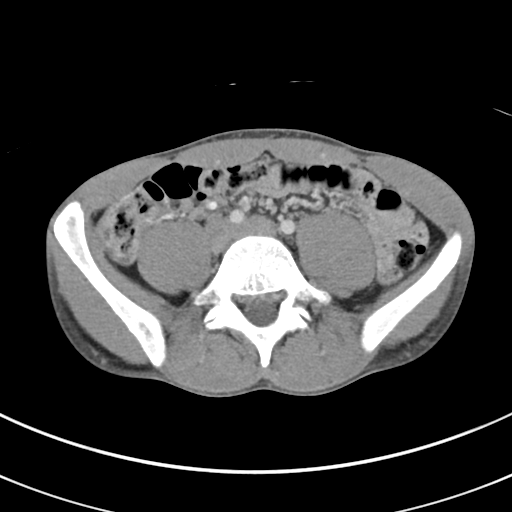
[im 46/88  soft-tissue]
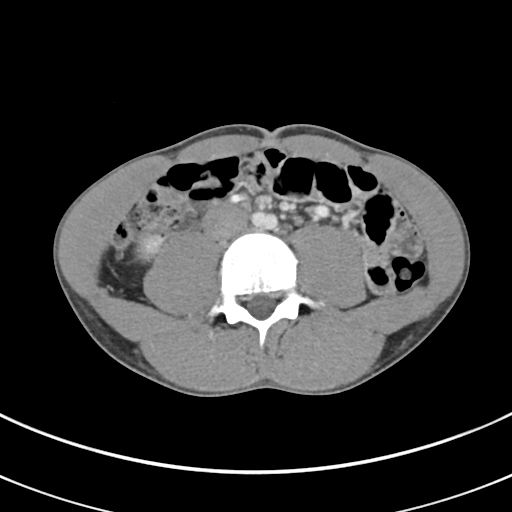
[im 49/88  soft-tissue]
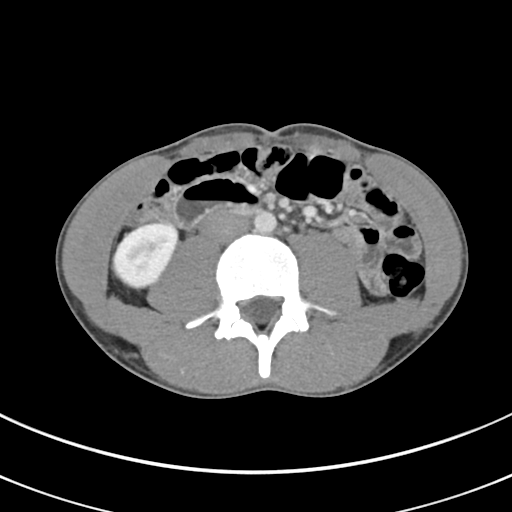
[im 56/88  soft-tissue]
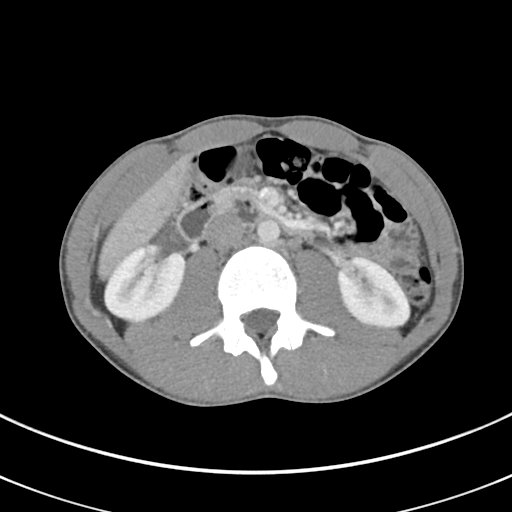
[im 56/88  bone]
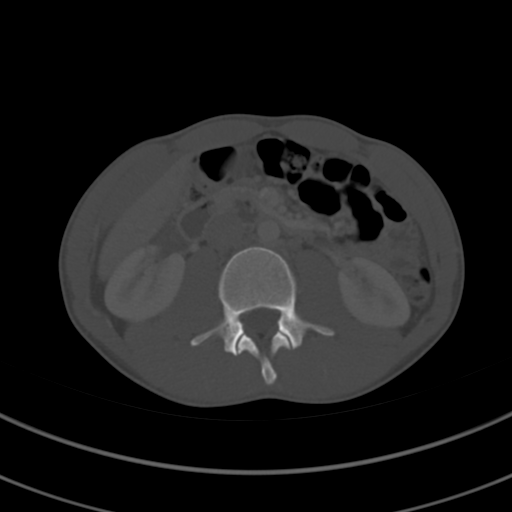
[im 63/88  soft-tissue]
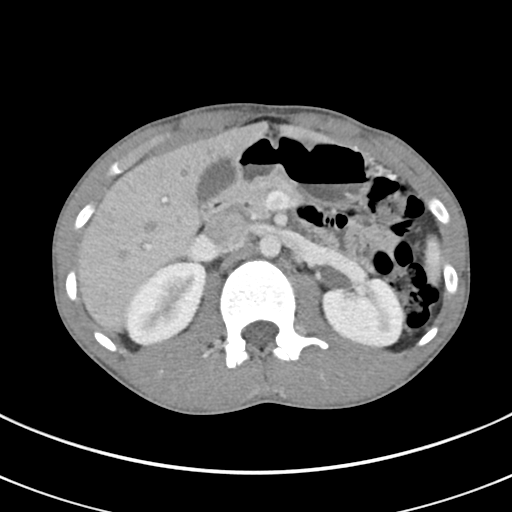
[im 70/88  soft-tissue]
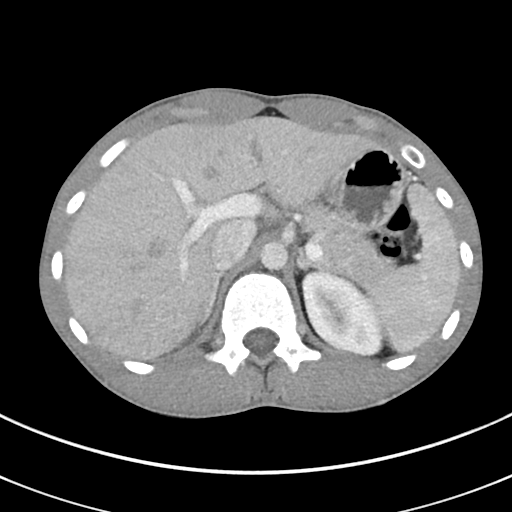
[im 77/88  soft-tissue]
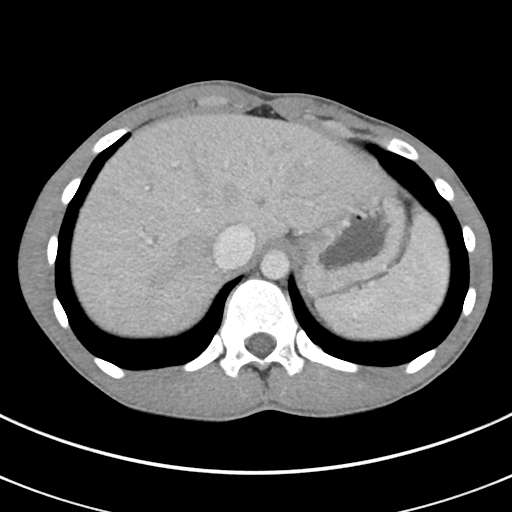
[im 84/88  soft-tissue]
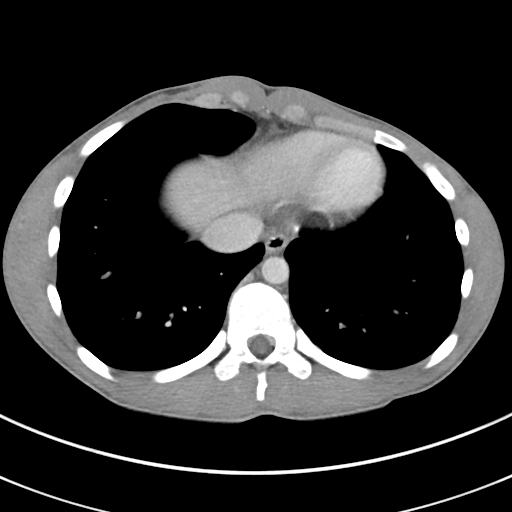

[Series 6: coronal soft tissue · coronal · 0.69mm/px · 3 of 83 slices shown]
[im 28/83  soft-tissue]
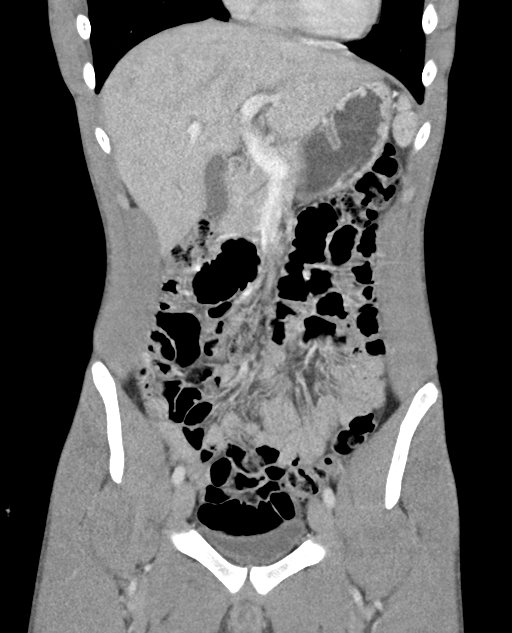
[im 37/83  soft-tissue]
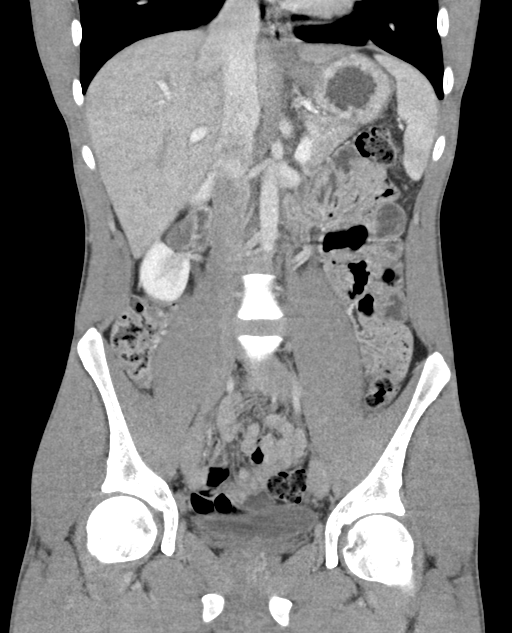
[im 46/83  soft-tissue]
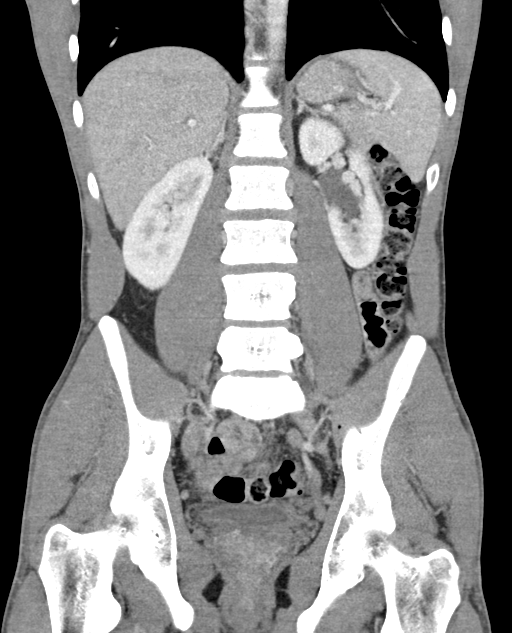

[16 of 46 positions shown; findings below may reference images not displayed]

FINDINGS: Lower chest: No acute abnormality.

Hepatobiliary: No focal liver abnormality is seen. No gallstones,
gallbladder wall thickening, or biliary dilatation.

Pancreas: Unremarkable. No pancreatic ductal dilatation or
surrounding inflammatory changes.

Spleen: Normal in size without focal abnormality.

Adrenals/Urinary Tract: Adrenal glands are unremarkable. Kidneys are
normal, without renal calculi, focal lesion, or hydronephrosis.
Bladder is unremarkable for the degree of distention.

Stomach/Bowel: Stomach is within normal limits. Appendix appears
normal. No evidence of bowel wall thickening, distention, or
inflammatory changes.

Vascular/Lymphatic: Focal narrowing of the left renal vein between
the SMA and aorta. No enlarged abdominal or pelvic lymph nodes.

Reproductive: Prostate is unremarkable.

Other: Trace free fluid in the pelvis. No pneumoperitoneum.

Musculoskeletal: No acute or significant osseous findings.
IMPRESSION: 1. No acute intra-abdominal process. Normal appendix.
2. Trace free fluid in the pelvis, nonspecific.
3. Focal narrowing of the left renal vein between the SMA and aorta,
which can be seen with nutcracker syndrome.

## 2021-05-15 ENCOUNTER — Encounter (HOSPITAL_COMMUNITY): Payer: Self-pay | Admitting: Emergency Medicine

## 2021-05-15 ENCOUNTER — Ambulatory Visit (HOSPITAL_COMMUNITY)
Admission: EM | Admit: 2021-05-15 | Discharge: 2021-05-15 | Disposition: A | Discharge status: Home / Self Care | Payer: Medicaid Other | Attending: Emergency Medicine | Admitting: Emergency Medicine

## 2021-05-15 DIAGNOSIS — R051 Acute cough: Secondary | ICD-10-CM | POA: Diagnosis not present

## 2021-05-15 MED ORDER — MOMETASONE FUROATE 50 MCG/ACT NA SUSP: 2.0000 | Freq: Every day | NASAL | 12 refills | Status: DC

## 2021-05-15 MED ORDER — ALBUTEROL SULFATE HFA 108 (90 BASE) MCG/ACT IN AERS: 1.0000 | INHALATION_SPRAY | Freq: Four times a day (QID) | RESPIRATORY_TRACT | 0 refills | Status: DC | PRN

## 2021-05-15 MED ORDER — PROMETHAZINE-DM 6.25-15 MG/5ML PO SYRP: 5.0000 mL | ORAL_SOLUTION | Freq: Four times a day (QID) | ORAL | 0 refills | Status: DC | PRN

## 2021-05-15 MED ORDER — PREDNISONE 50 MG PO TABS: ORAL_TABLET | ORAL | 0 refills | Status: DC

## 2021-05-15 NOTE — ED Triage Notes (Signed)
Pt reports cough that feels congested but nothing will come up for 3 days. Took whole bottle mucinex and cough drops with no relief.  ?

## 2021-05-15 NOTE — ED Provider Notes (Signed)
?[facility] ? ? ?MRN: [id number] DOB: August 04, 2000 ? ?Subjective:  ? ?Chief Complaint;  ?Chief Complaint  ?Patient presents with  ? Cough  ? ? ?Nicholas Dunn is a 21 y.o. male presenting for cough for the last 3 days.  He has history of allergies but denies fever, wheezing or productive discharge from his cough.  Currently he is using cough drops which are not effective ?No current facility-administered medications for this encounter. ? ?Current Outpatient Medications:  ?  albuterol (VENTOLIN HFA) 108 (90 Base) MCG/ACT inhaler, Inhale 1-2 puffs into the lungs every 6 (six) hours as needed for wheezing or shortness of breath., Disp: 1 each, Rfl: 0 ?  mometasone (NASONEX) 50 MCG/ACT nasal spray, Place 2 sprays into the nose daily., Disp: 1 each, Rfl: 12 ?  predniSONE (DELTASONE) 50 MG tablet, Take 1 pill daily for 5 days as directed, Disp: 5 tablet, Rfl: 0 ?  promethazine-dextromethorphan (PROMETHAZINE-DM) 6.25-15 MG/5ML syrup, Take 5 mLs by mouth 4 (four) times daily as needed for cough., Disp: 118 mL, Rfl: 0 ?  acetaminophen (TYLENOL) 325 MG tablet, Take 650 mg by mouth every 6 (six) hours as needed., Disp: , Rfl:  ?  famotidine (PEPCID) 20 MG tablet, Take 1 tablet (20 mg total) by mouth 2 (two) times daily., Disp: 60 tablet, Rfl: 0 ?  NON FORMULARY, Throat spray, Disp: , Rfl:  ?  Spacer/Aero-Holding Chambers (AEROCHAMBER PLUS) inhaler, Use with inhaler, Disp: 1 each, Rfl: 2  ? ?No Known Allergies ? ?History reviewed. No pertinent past medical history.  ? ?Review of Systems  ?Constitutional:  Negative for fever.  ?Respiratory:  Positive for cough. Negative for hemoptysis, sputum production, shortness of breath and wheezing.   ?     Nonproductive bark-like cough with clear lungs throughout  ?All other systems reviewed and are negative. ? ? ?Objective:  ? ?Vitals: ?BP 120/61 (BP Location: Left Arm)   Pulse 80   Temp 97.9 ?F (36.6 ?C) (Oral)   Resp 14   SpO2 98%  ? ?Physical Exam ?Vitals and  nursing note reviewed.  ?Constitutional:   ?   General: He is not in acute distress. ?   Appearance: He is well-developed.  ?HENT:  ?   Head: Normocephalic and atraumatic.  ?Eyes:  ?   Conjunctiva/sclera: Conjunctivae normal.  ?Cardiovascular:  ?   Rate and Rhythm: Normal rate and regular rhythm.  ?   Heart sounds: No murmur heard. ?Pulmonary:  ?   Effort: Pulmonary effort is normal.  ?   Breath sounds: Normal breath sounds. No stridor. No wheezing, rhonchi or rales.  ?Chest:  ?   Chest wall: No tenderness.  ?Abdominal:  ?   Palpations: Abdomen is soft.  ?   Tenderness: There is no abdominal tenderness.  ?Musculoskeletal:     ?   General: No swelling.  ?   Cervical back: Neck supple.  ?Skin: ?   General: Skin is warm and dry.  ?   Capillary Refill: Capillary refill takes less than 2 seconds.  ?Neurological:  ?   Mental Status: He is alert.  ?Psychiatric:     ?   Mood and Affect: Mood normal.  ? ? ?No results found for this or any previous visit (from the past 24 hour(s)). ? ?No results found.  ?  ? ?Assessment and Plan :  ? ?1. Acute cough   ? ? ?Meds ordered this encounter  ?Medications  ? albuterol (VENTOLIN HFA) 108 (90 Base)  MCG/ACT inhaler  ?  Sig: Inhale 1-2 puffs into the lungs every 6 (six) hours as needed for wheezing or shortness of breath.  ?  Dispense:  1 each  ?  Refill:  0  ? mometasone (NASONEX) 50 MCG/ACT nasal spray  ?  Sig: Place 2 sprays into the nose daily.  ?  Dispense:  1 each  ?  Refill:  12  ? predniSONE (DELTASONE) 50 MG tablet  ?  Sig: Take 1 pill daily for 5 days as directed  ?  Dispense:  5 tablet  ?  Refill:  0  ? promethazine-dextromethorphan (PROMETHAZINE-DM) 6.25-15 MG/5ML syrup  ?  Sig: Take 5 mLs by mouth 4 (four) times daily as needed for cough.  ?  Dispense:  118 mL  ?  Refill:  0  ? ? ?MDM:  ?Nicholas Dunn is a 21 y.o. male presenting for acute cough likely related to allergies.  He has been treated for similar problems in the past.  Patient was taking only Mucinex without  relief and using cough drops we changed his medication regime and encouraged him to follow-up with PCP or return if not improving for reevaluation.  I discussed treatment, follow up and return instructions. Questions were answered. Patient stated understanding of instructions and is stable for discharge. ? ?Dewaine Conger FNP-C MCN  ?  ?Jone Baseman, NP ?05/15/21 1215 ? ?

## 2021-05-15 NOTE — Discharge Instructions (Signed)
Take medications as directed return if not improving over the next week sooner if worse or new symptoms develop for reevaluation ?

## 2021-05-18 ENCOUNTER — Other Ambulatory Visit: Payer: Self-pay

## 2021-05-18 ENCOUNTER — Encounter (HOSPITAL_COMMUNITY): Payer: Self-pay | Admitting: *Deleted

## 2021-05-18 ENCOUNTER — Ambulatory Visit (HOSPITAL_COMMUNITY)
Admission: EM | Admit: 2021-05-18 | Discharge: 2021-05-18 | Disposition: A | Discharge status: Home / Self Care | Payer: Medicaid Other | Attending: Nurse Practitioner | Admitting: Nurse Practitioner

## 2021-05-18 DIAGNOSIS — R051 Acute cough: Secondary | ICD-10-CM | POA: Diagnosis not present

## 2021-05-18 MED ORDER — BENZONATATE 100 MG PO CAPS: 100.0000 mg | ORAL_CAPSULE | Freq: Three times a day (TID) | ORAL | 0 refills | Status: DC | PRN

## 2021-05-18 NOTE — Discharge Instructions (Addendum)
-   You can use Tessalon perles to help with the dry cough - do not take at the same time as promethazine-DM ?- Please continue all of the medications that you were prescribed previously; the cough should start to improve in the next couple of days ?

## 2021-05-18 NOTE — ED Triage Notes (Signed)
Pt reports coming to [facility] on Friday because of cough. Pt reports cough is now productive and his voice is hoarse. ?

## 2021-05-18 NOTE — ED Provider Notes (Signed)
?[facility] ? ? ? ?CSN: [id number] ?Arrival date & time: 05/18/21  1032 ? ? ?  ? ?History   ?Chief Complaint ?Chief Complaint  ?Patient presents with  ? Cough  ? Hoarse  ? ? ?HPI ?Nicholas Dunn is a 21 y.o. male.  ? ?Patient presents with 1 week of dry, painful cough, irritated throat.  He denies fevers, shortness of breath, chest pain, wheezing, chest tightness, congestion, nausea/vomiting, diarrhea, change in appetite.  He reports his wife is getting frustrated because they are not sleeping at nighttime.  He does report the cough is worse at night. ? ?He was seen in urgent care approximately 3 days ago was treated with albuterol, low-dose oral corticosteroid, intranasal corticosteroid, and oral antihistamine.  He reports he is taking his medications along with Mucinex, Hall's cough drops, and nothing is helping. ? ? ?History reviewed. No pertinent past medical history. ? ?Patient Active Problem List  ? Diagnosis Date Noted  ? Adjustment disorder with mixed anxiety and depressed mood 12/01/2020  ? ? ?History reviewed. No pertinent surgical history. ? ? ?Home Medications   ? ?Prior to Admission medications   ?Medication Sig Start Date End Date Taking? Authorizing Provider  ?benzonatate (TESSALON) 100 MG capsule Take 1 capsule (100 mg total) by mouth 3 (three) times daily as needed for cough. Do not take while driving or operating heavy machinery 05/18/21  Yes Eulogio Bear, NP  ?acetaminophen (TYLENOL) 325 MG tablet Take 650 mg by mouth every 6 (six) hours as needed.    [provider]  ?albuterol (VENTOLIN HFA) 108 (90 Base) MCG/ACT inhaler Inhale 1-2 puffs into the lungs every 6 (six) hours as needed for wheezing or shortness of breath. 05/15/21   Hezzie Bump, NP  ?famotidine (PEPCID) 20 MG tablet Take 1 tablet (20 mg total) by mouth 2 (two) times daily. 03/31/20   Melynda Ripple, MD  ?mometasone (NASONEX) 50 MCG/ACT nasal spray Place 2 sprays into the nose daily. 05/15/21   Hezzie Bump, NP  ?NON FORMULARY Throat spray    [provider]  ?predniSONE (DELTASONE) 50 MG tablet Take 1 pill daily for 5 days as directed 05/15/21   Hezzie Bump, NP  ?promethazine-dextromethorphan (PROMETHAZINE-DM) 6.25-15 MG/5ML syrup Take 5 mLs by mouth 4 (four) times daily as needed for cough. 05/15/21   Hezzie Bump, NP  ?Spacer/Aero-Holding Chambers (AEROCHAMBER PLUS) inhaler Use with inhaler 03/31/20   Melynda Ripple, MD  ? ? ?Family History ?Family History  ?Problem Relation Age of Onset  ? Healthy Mother   ? Healthy Father   ? ? ?Social History ?Social History  ? ?Tobacco Use  ? Smoking status: Some Days  ?  Types: Cigars  ? Smokeless tobacco: Never  ?Vaping Use  ? Vaping Use: Every day  ? Substances: Nicotine, Flavoring  ?Substance Use Topics  ? Alcohol use: Not Currently  ? Drug use: Not Currently  ? ? ? ?Allergies   ?Patient has no known allergies. ? ? ?Review of Systems ?Review of Systems ?Per HPI ? ?Physical Exam ?Triage Vital Signs ?ED Triage Vitals  ?Enc Vitals Group  ?   BP 05/18/21 1303 139/65  ?   Pulse Rate 05/18/21 1303 77  ?   Resp 05/18/21 1303 20  ?   Temp 05/18/21 1303 97.8 ?F (36.6 ?C)  ?   Temp src --   ?   SpO2 05/18/21 1303 97 %  ?   Weight --   ?  Height --   ?   Head Circumference --   ?   Peak Flow --   ?   Pain Score 05/18/21 1301 8  ?   Pain Loc --   ?   Pain Edu? --   ?   Excl. in [place]? --   ? ?No data found. ? ?Updated Vital Signs ?BP 139/65   Pulse 77   Temp 97.8 ?F (36.6 ?C)   Resp 20   SpO2 97%  ? ?Visual Acuity ?Right Eye Distance:   ?Left Eye Distance:   ?Bilateral Distance:   ? ?Right Eye Near:   ?Left Eye Near:    ?Bilateral Near:    ? ?Physical Exam ?Vitals and nursing note reviewed.  ?Constitutional:   ?   General: He is not in acute distress. ?   Appearance: Normal appearance. He is not toxic-appearing.  ?HENT:  ?   Head: Normocephalic and atraumatic.  ?   Right Ear: Tympanic membrane, ear canal and external ear normal. There is no impacted cerumen.  ?   Left  Ear: Tympanic membrane, ear canal and external ear normal. There is no impacted cerumen.  ?   Nose: Nose normal. No congestion.  ?   Mouth/Throat:  ?   Mouth: Mucous membranes are moist.  ?   Pharynx: Oropharynx is clear. Uvula midline. Posterior oropharyngeal erythema present.  ?   Tonsils: No tonsillar exudate.  ?   Comments: Cobblestoning of posterior pharynx, significant post nasal drainage appreciated ?Eyes:  ?   General:     ?   Right eye: No discharge.     ?   Left eye: No discharge.  ?   Extraocular Movements: Extraocular movements intact.  ?Cardiovascular:  ?   Rate and Rhythm: Normal rate and regular rhythm.  ?Pulmonary:  ?   Effort: Pulmonary effort is normal. No respiratory distress.  ?   Breath sounds: Normal breath sounds. No wheezing, rhonchi or rales.  ?Musculoskeletal:  ?   Cervical back: Normal range of motion.  ?Lymphadenopathy:  ?   Cervical: No cervical adenopathy.  ?Skin: ?   General: Skin is warm and dry.  ?   Capillary Refill: Capillary refill takes less than 2 seconds.  ?   Coloration: Skin is not jaundiced or pale.  ?   Findings: No erythema or rash.  ?Neurological:  ?   Mental Status: He is alert.  ?Psychiatric:     ?   Behavior: Behavior is cooperative.  ? ? ? ?UC Treatments / Results  ?Labs ?(all labs ordered are listed, but only abnormal results are displayed) ?Labs Reviewed - No data to display ? ?EKG ? ? ?Radiology ?No results found. ? ?Procedures ?Procedures (including critical care time) ? ?Medications Ordered in UC ?Medications - No data to display ? ?Initial Impression / Assessment and Plan / UC Course  ?I have reviewed the triage vital signs and the nursing notes. ? ?Pertinent labs & imaging results that were available during my care of the patient were reviewed by me and considered in my medical decision making (see chart for details). ? ?  ?Treat acute cough with cough suppressant Tessalon perles.  No signs or symptoms of ongoing infection or indication for chest x-ray today.   Continue previously prescribed medication.  Note given for work. ?Final Clinical Impressions(s) / UC Diagnoses  ? ?Final diagnoses:  ?Acute cough  ? ? ? ?Discharge Instructions   ? ?  ?- You can use Tessalon perles to  help with the dry cough - do not take at the same time as promethazine-DM ?- Please continue all of the medications that you were prescribed previously; the cough should start to improve in the next couple of days ? ? ? ? ?ED Prescriptions   ? ? Medication Sig Dispense Auth. Provider  ? benzonatate (TESSALON) 100 MG capsule Take 1 capsule (100 mg total) by mouth 3 (three) times daily as needed for cough. Do not take while driving or operating heavy machinery 21 capsule Eulogio Bear, NP  ? ?  ? ?PDMP not reviewed this encounter. ?  ?Eulogio Bear, NP ?05/18/21 1402 ? ?

## 2022-09-13 ENCOUNTER — Encounter (HOSPITAL_COMMUNITY): Payer: Self-pay | Admitting: Emergency Medicine

## 2022-09-13 ENCOUNTER — Ambulatory Visit (HOSPITAL_COMMUNITY)
Admission: EM | Admit: 2022-09-13 | Discharge: 2022-09-13 | Disposition: A | Discharge status: Home / Self Care | Payer: Medicaid Other | Attending: Internal Medicine | Admitting: Internal Medicine

## 2022-09-13 DIAGNOSIS — Z1152 Encounter for screening for COVID-19: Secondary | ICD-10-CM | POA: Insufficient documentation

## 2022-09-13 DIAGNOSIS — B349 Viral infection, unspecified: Secondary | ICD-10-CM | POA: Insufficient documentation

## 2022-09-13 MED ORDER — PROMETHAZINE-DM 6.25-15 MG/5ML PO SYRP: 5.0000 mL | ORAL_SOLUTION | Freq: Four times a day (QID) | ORAL | 0 refills | Status: AC | PRN

## 2022-09-13 MED ORDER — ONDANSETRON 4 MG PO TBDP: 4.0000 mg | ORAL_TABLET | Freq: Three times a day (TID) | ORAL | 0 refills | Status: AC | PRN

## 2022-09-13 NOTE — ED Triage Notes (Signed)
Pt c/o nausea that started yesterday and cough that is nonproductive started today.

## 2022-09-13 NOTE — Discharge Instructions (Addendum)
Please increase oral fluid intake Take medications as prescribed Please take Tylenol or ibuprofen as needed for pain and/or fever Will call you with recommendations if labs are abnormal. If you have worsening symptoms please return to urgent care to be reevaluated

## 2022-09-13 NOTE — ED Provider Notes (Signed)
[facility]    CSN: [id number] Arrival date & time: 09/13/22  1239      History   Chief Complaint Chief Complaint  Patient presents with   Cough   Nausea    HPI Nicholas Dunn is a 22 y.o. male comes to the urgent care with 1 day history of nausea without vomiting and dry cough.  Patient's symptoms started fairly abruptly and has been persistent.  No shortness of breath or wheezing.  Patient's wife had similar symptoms a few days ago.  Patient's mother also endorses similar symptoms.  No recent travel.  No change in dietary habits.  No generalized body aches or chills.  Patient feels tired and fatigued.  Patient is not vaccinated against COVID-19 virus and has not been diagnosed with COVID-19 infection in the past.   HPI  History reviewed. No pertinent past medical history.  Patient Active Problem List   Diagnosis Date Noted   Adjustment disorder with mixed anxiety and depressed mood 12/01/2020    History reviewed. No pertinent surgical history.     Home Medications    Prior to Admission medications   Medication Sig Start Date End Date Taking? Authorizing Provider  ondansetron (ZOFRAN-ODT) 4 MG disintegrating tablet Take 1 tablet (4 mg total) by mouth every 8 (eight) hours as needed for nausea or vomiting. 09/13/22  Yes Orvie Caradine, Britta Mccreedy, MD  promethazine-dextromethorphan (PROMETHAZINE-DM) 6.25-15 MG/5ML syrup Take 5 mLs by mouth 4 (four) times daily as needed for cough. 09/13/22  Yes Fredick Schlosser, Britta Mccreedy, MD  acetaminophen (TYLENOL) 325 MG tablet Take 650 mg by mouth every 6 (six) hours as needed.    [provider]  NON FORMULARY Throat spray    [provider]    Family History Family History  Problem Relation Age of Onset   Healthy Mother    Healthy Father     Social History Social History   Tobacco Use   Smoking status: Some Days    Types: Cigars   Smokeless tobacco: Never  Vaping Use   Vaping status: Every Day   Substances:  Nicotine, Flavoring  Substance Use Topics   Alcohol use: Not Currently   Drug use: Not Currently     Allergies   Patient has no known allergies.   Review of Systems Review of Systems As per HPI  Physical Exam Triage Vital Signs ED Triage Vitals  Encounter Vitals Group     BP 09/13/22 1252 125/78     Systolic BP Percentile --      Diastolic BP Percentile --      Pulse Rate 09/13/22 1252 (!) 56     Resp 09/13/22 1252 14     Temp 09/13/22 1252 97.8 F (36.6 C)     Temp Source 09/13/22 1252 Oral     SpO2 09/13/22 1252 98 %     Weight --      Height --      Head Circumference --      Peak Flow --      Pain Score 09/13/22 1250 0     Pain Loc --      Pain Education --      Exclude from Growth Chart --    No data found.  Updated Vital Signs BP 125/78 (BP Location: Right Arm)   Pulse (!) 56   Temp 97.8 F (36.6 C) (Oral)   Resp 14   SpO2 98%   Visual Acuity Right Eye Distance:   Left Eye  Distance:   Bilateral Distance:    Right Eye Near:   Left Eye Near:    Bilateral Near:     Physical Exam Vitals and nursing note reviewed.  Constitutional:      General: He is not in acute distress.    Appearance: He is not ill-appearing.  Cardiovascular:     Rate and Rhythm: Normal rate and regular rhythm.     Pulses: Normal pulses.     Heart sounds: Normal heart sounds.  Pulmonary:     Effort: Pulmonary effort is normal.     Breath sounds: Normal breath sounds.  Abdominal:     General: Bowel sounds are normal.     Palpations: Abdomen is soft.  Neurological:     Mental Status: He is alert.      UC Treatments / Results  Labs (all labs ordered are listed, but only abnormal results are displayed) Labs Reviewed  SARS CORONAVIRUS 2 (TAT 6-24 HRS)    EKG   Radiology No results found.  Procedures Procedures (including critical care time)  Medications Ordered in UC Medications - No data to display  Initial Impression / Assessment and Plan / UC Course  I  have reviewed the triage vital signs and the nursing notes.  Pertinent labs & imaging results that were available during my care of the patient were reviewed by me and considered in my medical decision making (see chart for details).     1.  Acute viral illness: COVID-19 PCR test has been sent Patient advised to increase oral fluid intake Tylenol/ibuprofen as needed for pain and/or fever Will call patient with recommendations if labs are abnormal No indication for antivirals if COVID-19 is positive Promethazine DM as needed for cough Return precautions given. Final Clinical Impressions(s) / UC Diagnoses   Final diagnoses:  Viral illness     Discharge Instructions      Please increase oral fluid intake Take medications as prescribed Please take Tylenol or ibuprofen as needed for pain and/or fever Will call you with recommendations if labs are abnormal. If you have worsening symptoms please return to urgent care to be reevaluated   ED Prescriptions     Medication Sig Dispense Auth. Provider   promethazine-dextromethorphan (PROMETHAZINE-DM) 6.25-15 MG/5ML syrup Take 5 mLs by mouth 4 (four) times daily as needed for cough. 118 mL Pecolia Marando, Britta Mccreedy, MD   ondansetron (ZOFRAN-ODT) 4 MG disintegrating tablet Take 1 tablet (4 mg total) by mouth every 8 (eight) hours as needed for nausea or vomiting. 15 tablet Keithen Capo, Britta Mccreedy, MD      PDMP not reviewed this encounter.   Merrilee Jansky, MD 09/13/22 [phone]

## 2022-10-05 ENCOUNTER — Emergency Department (HOSPITAL_BASED_OUTPATIENT_CLINIC_OR_DEPARTMENT_OTHER)
Admission: EM | Admit: 2022-10-05 | Discharge: 2022-10-05 | Disposition: A | Discharge status: Home / Self Care | Payer: Medicaid Other | Attending: Emergency Medicine | Admitting: Emergency Medicine

## 2022-10-05 ENCOUNTER — Other Ambulatory Visit: Payer: Self-pay

## 2022-10-05 ENCOUNTER — Emergency Department (HOSPITAL_BASED_OUTPATIENT_CLINIC_OR_DEPARTMENT_OTHER): Payer: Medicaid Other

## 2022-10-05 ENCOUNTER — Encounter (HOSPITAL_BASED_OUTPATIENT_CLINIC_OR_DEPARTMENT_OTHER): Payer: Self-pay | Admitting: Emergency Medicine

## 2022-10-05 DIAGNOSIS — R1011 Right upper quadrant pain: Secondary | ICD-10-CM | POA: Insufficient documentation

## 2022-10-05 LAB — URINALYSIS, ROUTINE W REFLEX MICROSCOPIC
Bilirubin Urine: NEGATIVE
Glucose, UA: NEGATIVE mg/dL
Hgb urine dipstick: NEGATIVE
Ketones, ur: NEGATIVE mg/dL
Leukocytes,Ua: NEGATIVE
Nitrite: NEGATIVE
Protein, ur: NEGATIVE mg/dL
Specific Gravity, Urine: 1.028 (ref 1.005–1.030)
pH: 6 (ref 5.0–8.0)

## 2022-10-05 LAB — COMPREHENSIVE METABOLIC PANEL
ALT: 13 U/L (ref 0–44)
AST: 21 U/L (ref 15–41)
Albumin: 4.7 g/dL (ref 3.5–5.0)
Alkaline Phosphatase: 62 U/L (ref 38–126)
Anion gap: 7 (ref 5–15)
BUN: 11 mg/dL (ref 6–20)
CO2: 27 mmol/L (ref 22–32)
Calcium: 9.9 mg/dL (ref 8.9–10.3)
Chloride: 103 mmol/L (ref 98–111)
Creatinine, Ser: 0.83 mg/dL (ref 0.61–1.24)
GFR, Estimated: >60 mL/min (ref >60)
Glucose, Bld: 105 mg/dL — ABNORMAL HIGH (ref 70–99)
Potassium: 4.2 mmol/L (ref 3.5–5.1)
Sodium: 137 mmol/L (ref 135–145)
Total Bilirubin: 0.6 mg/dL (ref 0.3–1.2)
Total Protein: 7.1 g/dL (ref 6.5–8.1)

## 2022-10-05 LAB — CBC
HCT: 45.8 % (ref 39.0–52.0)
Hemoglobin: 15.2 g/dL (ref 13.0–17.0)
MCH: 28.6 pg (ref 26.0–34.0)
MCHC: 33.2 g/dL (ref 30.0–36.0)
MCV: 86.1 fL (ref 80.0–100.0)
Platelets: 169 10*3/uL (ref 150–400)
RBC: 5.32 MIL/uL (ref 4.22–5.81)
RDW: 12.7 % (ref 11.5–15.5)
WBC: 4.2 10*3/uL (ref 4.0–10.5)
nRBC: 0 % (ref 0.0–0.2)

## 2022-10-05 LAB — LIPASE, BLOOD: Lipase: 13 U/L (ref 11–51)

## 2022-10-05 MED ORDER — ACETAMINOPHEN 325 MG PO TABS
650.0000 mg | ORAL_TABLET | Freq: Once | ORAL | Status: AC
  Administered 2022-10-05: 650 mg via ORAL
  Filled 2022-10-05: qty 2

## 2022-10-05 MED ORDER — MORPHINE SULFATE (PF) 4 MG/ML IV SOLN
4.0000 mg | Freq: Once | INTRAVENOUS | Status: AC
  Administered 2022-10-05: 4 mg via INTRAVENOUS
  Filled 2022-10-05: qty 1

## 2022-10-05 MED ORDER — ALUM & MAG HYDROXIDE-SIMETH 200-200-20 MG/5ML PO SUSP
30.0000 mL | Freq: Once | ORAL | Status: AC
  Administered 2022-10-05: 30 mL via ORAL
  Filled 2022-10-05: qty 30

## 2022-10-05 MED ORDER — FAMOTIDINE IN NACL 20-0.9 MG/50ML-% IV SOLN
20.0000 mg | Freq: Once | INTRAVENOUS | Status: AC
  Administered 2022-10-05: 20 mg via INTRAVENOUS
  Filled 2022-10-05: qty 50

## 2022-10-05 MED ORDER — IOHEXOL 300 MG/ML  SOLN
100.0000 mL | Freq: Once | INTRAMUSCULAR | Status: AC | PRN
  Administered 2022-10-05: 75 mL via INTRAVENOUS

## 2022-10-05 MED ORDER — FAMOTIDINE 20 MG PO TABS: 20.0000 mg | ORAL_TABLET | Freq: Every day | ORAL | 0 refills | Status: AC

## 2022-10-05 NOTE — ED Notes (Signed)
Pt provided with sprite and crackers for PO challenge.

## 2022-10-05 NOTE — ED Provider Notes (Signed)
[place] [facility] AT [facility] Provider Note   CSN: [id number] Arrival date & time: 10/05/22  4098     History  Chief Complaint  Patient presents with   Abdominal Pain    Nicholas Dunn is a 22 y.o. male.  Patient is a 22 year old male with no significant past medical history presenting to the emergency department with abdominal pain.  He states that he woke up around 630 this morning with right-sided abdominal pain.  He states it feels like a sharp stabbing pain in his started to radiate to the right side of his back.  He denies any nausea or vomiting, diarrhea or constipation, dysuria or hematuria, fevers or chills.  He states has never had this pain before.  Denies prior abdominal surgeries.  The history is provided by the patient.  Abdominal Pain      Home Medications Prior to Admission medications   Medication Sig Start Date End Date Taking? Authorizing Provider  famotidine (PEPCID) 20 MG tablet Take 1 tablet (20 mg total) by mouth daily. 10/05/22  Yes Elayne Snare K, DO  acetaminophen (TYLENOL) 325 MG tablet Take 650 mg by mouth every 6 (six) hours as needed.    [provider]  NON FORMULARY Throat spray    [provider]  ondansetron (ZOFRAN-ODT) 4 MG disintegrating tablet Take 1 tablet (4 mg total) by mouth every 8 (eight) hours as needed for nausea or vomiting. 09/13/22   Lamptey, Britta Mccreedy, MD  promethazine-dextromethorphan (PROMETHAZINE-DM) 6.25-15 MG/5ML syrup Take 5 mLs by mouth 4 (four) times daily as needed for cough. 09/13/22   Lamptey, Britta Mccreedy, MD      Allergies    Patient has no known allergies.    Review of Systems   Review of Systems  Gastrointestinal:  Positive for abdominal pain.    Physical Exam Updated Vital Signs BP 116/72   Pulse 62   Temp 97.7 F (36.5 C) (Oral)   Resp 13   Ht 6\' 2"  (1.88 m)   Wt 68 kg   SpO2 98%   BMI 19.26 kg/m  Physical Exam Vitals and nursing note reviewed.   Constitutional:      General: He is not in acute distress.    Appearance: He is well-developed.  HENT:     Head: Normocephalic and atraumatic.     Mouth/Throat:     Mouth: Mucous membranes are moist.  Eyes:     Extraocular Movements: Extraocular movements intact.  Cardiovascular:     Rate and Rhythm: Normal rate and regular rhythm.     Heart sounds: Normal heart sounds.  Pulmonary:     Effort: Pulmonary effort is normal.  Abdominal:     General: Abdomen is flat.     Palpations: Abdomen is soft.     Tenderness: There is abdominal tenderness in the right upper quadrant and right lower quadrant. There is guarding. There is no right CVA tenderness, left CVA tenderness or rebound.  Skin:    General: Skin is warm and dry.  Neurological:     General: No focal deficit present.     Mental Status: He is alert and oriented to person, place, and time.  Psychiatric:        Mood and Affect: Mood normal.        Behavior: Behavior normal.     ED Results / Procedures / Treatments   Labs (all labs ordered are listed, but only abnormal results are displayed) Labs Reviewed  COMPREHENSIVE METABOLIC  PANEL - Abnormal; Notable for the following components:      Result Value   Glucose, Bld 105 (*)    All other components within normal limits  LIPASE, BLOOD  CBC  URINALYSIS, ROUTINE W REFLEX MICROSCOPIC    EKG None  Radiology CT ABDOMEN PELVIS W CONTRAST  Result Date: 10/05/2022 CLINICAL DATA:  Right lower quadrant abdominal pain EXAM: CT ABDOMEN AND PELVIS WITH CONTRAST TECHNIQUE: Multidetector CT imaging of the abdomen and pelvis was performed using the standard protocol following bolus administration of intravenous contrast. RADIATION DOSE REDUCTION: This exam was performed according to the departmental dose-optimization program which includes automated exposure control, adjustment of the mA and/or kV according to patient size and/or use of iterative reconstruction technique. CONTRAST:   75mL OMNIPAQUE IOHEXOL 300 MG/ML  SOLN COMPARISON:  None Available. FINDINGS: Lower chest: No acute abnormality. Hepatobiliary: No focal liver abnormality is seen. No gallstones, gallbladder wall thickening, or biliary dilatation. Pancreas: Unremarkable. No pancreatic ductal dilatation or surrounding inflammatory changes. Spleen: Normal in size without focal abnormality. Adrenals/Urinary Tract: Adrenal glands are unremarkable. Kidneys are normal, without renal calculi, focal lesion, or hydronephrosis. Bladder is unremarkable. Stomach/Bowel: Stomach is within normal limits. Appendix appears normal. No evidence of bowel wall thickening, distention, or inflammatory changes. Vascular/Lymphatic: No significant vascular findings are present. No enlarged abdominal or pelvic lymph nodes. Reproductive: Prostate is unremarkable. Other: No abdominal wall hernia or abnormality. No abdominopelvic ascites. Musculoskeletal: No acute or significant osseous findings. IMPRESSION: Normal CT scan of the abdomen and pelvis. No abnormality to explain the patient's right lower quadrant abdominal pain. Electronically Signed   By: Malachy Moan M.D.   On: 10/05/2022 08:47    Procedures Procedures    Medications Ordered in ED Medications  morphine (PF) 4 MG/ML injection 4 mg (4 mg Intravenous Given 10/05/22 0802)  iohexol (OMNIPAQUE) 300 MG/ML solution 100 mL (75 mLs Intravenous Contrast Given 10/05/22 0824)  famotidine (PEPCID) IVPB 20 mg premix (0 mg Intravenous Stopped 10/05/22 1035)  alum & mag hydroxide-simeth (MAALOX/MYLANTA) 200-200-20 MG/5ML suspension 30 mL (30 mLs Oral Given 10/05/22 1007)  acetaminophen (TYLENOL) tablet 650 mg (650 mg Oral Given 10/05/22 1006)    ED Course/ Medical Decision Making/ A&P Clinical Course as of 10/05/22 1132  Tue Oct 05, 2022  0903 CTAP without acute abnormality [VK]  0932 Patient reports improvement of pain though not completely resolved. Denies testicular pain or swelling. Mild  tenderness to RUQ on exam. Will be given GI cocktail and PO challenge. [VK]  1131 Upon reassessment, the patient's pain has improved and he is tolerating p.o.  He is stable for discharge home with outpatient follow-up. [VK]    Clinical Course User Index [VK] Nicholas Maus, DO                                 Medical Decision Making This patient presents to the ED with chief complaint(s) of R-sided abd pain with no pertinent past medical history which further complicates the presenting complaint. The complaint involves an extensive differential diagnosis and also carries with it a high risk of complications and morbidity.    The differential diagnosis includes pyelonephritis, nephrolithiasis, cholelithiasis, cholecystitis, appendicitis, pancreatitis, hepatitis, gastroenteritis  Additional history obtained: Additional history obtained from n/A Records reviewed Care Everywhere/External Records  ED Course and Reassessment: On patient's arrival he is hemodynamically stable in no acute distress though mildly uncomfortable appearing.  Patient was initially evaluated by  triage and had labs and urine ordered.  The patient will additionally have CT abdomen pelvis and will be given morphine for pain to evaluate for cause of his symptoms and he will be closely reassessed.  Independent labs interpretation:  The following labs were independently interpreted: within normal range  Independent visualization of imaging: - I independently visualized the following imaging with scope of interpretation limited to determining acute life threatening conditions related to emergency care: CTAP, which revealed no acute abnormality  Consultation: - Consulted or discussed management/test interpretation w/ external professional: N/A  Consideration for admission or further workup: Patient has no emergent conditions requiring admission or further work-up at this time and is stable for discharge home with primary  care follow-up  Social Determinants of health: N/A    Amount and/or Complexity of Data Reviewed Labs: ordered. Radiology: ordered.  Risk OTC drugs. Prescription drug management.          Final Clinical Impression(s) / ED Diagnoses Final diagnoses:  Right upper quadrant abdominal pain    Rx / DC Orders ED Discharge Orders          Ordered    famotidine (PEPCID) 20 MG tablet  Daily        10/05/22 1131              Nicholas Dunn, [place] 10/05/22 1132

## 2022-10-05 NOTE — ED Triage Notes (Signed)
Pt arrived POV from home, caox4, ambulatory c/o abd pain RUQ/RLQ described as sharp, radiating to R flank that started at approx 0600 this morning. 8/10, pt states pushing on it/pressure makes pain better. Afebrile. Denies N/V/D. Last took Ibuprofen 0615 with no relief.

## 2022-10-05 NOTE — ED Notes (Signed)
Discharge instructions, follow up care, and prescription reviewed and explained, pt verbalized understanding and had no further questions on d/c. Pt caox4, ambulatory, NAD on d/c.  

## 2022-10-05 NOTE — Discharge Instructions (Signed)
Seen in the emergency department for your abdominal pain.  Your workup showed no signs of kidney stones, gallstones or appendicitis or other infection.  He may have inflammation in your stomach called gastroenteritis and I have given you an antacid that you should take daily for at least the next 1 to 2 weeks to help with your symptoms and you can take over-the-counter Maalox or Tylenol as needed for pain.  You should follow-up with your primary doctor to have your symptoms rechecked.  You should return to the emergency department if you have significantly worsening pain, fevers, repetitive vomiting or any other new or concerning symptoms.

## 2023-06-07 ENCOUNTER — Emergency Department (HOSPITAL_BASED_OUTPATIENT_CLINIC_OR_DEPARTMENT_OTHER)
Admission: EM | Admit: 2023-06-07 | Discharge: 2023-06-07 | Disposition: A | Discharge status: Other Acute Inpatient Hospital | Attending: Emergency Medicine | Admitting: Emergency Medicine

## 2023-06-07 ENCOUNTER — Other Ambulatory Visit: Payer: Self-pay

## 2023-06-07 ENCOUNTER — Emergency Department (HOSPITAL_BASED_OUTPATIENT_CLINIC_OR_DEPARTMENT_OTHER)

## 2023-06-07 DIAGNOSIS — D709 Neutropenia, unspecified: Secondary | ICD-10-CM | POA: Diagnosis not present

## 2023-06-07 DIAGNOSIS — R5081 Fever presenting with conditions classified elsewhere: Secondary | ICD-10-CM | POA: Diagnosis not present

## 2023-06-07 DIAGNOSIS — R509 Fever, unspecified: Secondary | ICD-10-CM | POA: Diagnosis present

## 2023-06-07 LAB — CBC WITH DIFFERENTIAL/PLATELET
Abs Immature Granulocytes: 0 10*3/uL (ref 0.00–0.07)
Basophils Absolute: 0 10*3/uL (ref 0.0–0.1)
Basophils Relative: 0 %
Eosinophils Absolute: 0 10*3/uL (ref 0.0–0.5)
Eosinophils Relative: 0 %
HCT: 39.9 % (ref 39.0–52.0)
Hemoglobin: 13.8 g/dL (ref 13.0–17.0)
Immature Granulocytes: 0 %
Lymphocytes Relative: 58 %
Lymphs Abs: 0.2 10*3/uL — ABNORMAL LOW (ref 0.7–4.0)
MCH: 28.8 pg (ref 26.0–34.0)
MCHC: 34.6 g/dL (ref 30.0–36.0)
MCV: 83.1 fL (ref 80.0–100.0)
Monocytes Absolute: 0 10*3/uL — ABNORMAL LOW (ref 0.1–1.0)
Monocytes Relative: 11 %
Neutro Abs: 0.1 10*3/uL — CL (ref 1.7–7.7)
Neutrophils Relative %: 31 %
Platelets: 118 10*3/uL — ABNORMAL LOW (ref 150–400)
RBC: 4.8 MIL/uL (ref 4.22–5.81)
RDW: 11.6 % (ref 11.5–15.5)
WBC: 0.4 10*3/uL — CL (ref 4.0–10.5)
nRBC: 0 % (ref 0.0–0.2)

## 2023-06-07 LAB — COMPREHENSIVE METABOLIC PANEL WITH GFR
ALT: 30 U/L (ref 0–44)
AST: 22 U/L (ref 15–41)
Albumin: 4.8 g/dL (ref 3.5–5.0)
Alkaline Phosphatase: 64 U/L (ref 38–126)
Anion gap: 10 (ref 5–15)
BUN: 10 mg/dL (ref 6–20)
CO2: 26 mmol/L (ref 22–32)
Calcium: 10 mg/dL (ref 8.9–10.3)
Chloride: 102 mmol/L (ref 98–111)
Creatinine, Ser: 0.91 mg/dL (ref 0.61–1.24)
GFR, Estimated: >60 mL/min (ref >60)
Glucose, Bld: 127 mg/dL — ABNORMAL HIGH (ref 70–99)
Potassium: 4.2 mmol/L (ref 3.5–5.1)
Sodium: 138 mmol/L (ref 135–145)
Total Bilirubin: 0.9 mg/dL (ref 0.0–1.2)
Total Protein: 7.5 g/dL (ref 6.5–8.1)

## 2023-06-07 LAB — PROTIME-INR
INR: 1.1 (ref 0.8–1.2)
Prothrombin Time: 14 s (ref 11.4–15.2)

## 2023-06-07 LAB — RESP PANEL BY RT-PCR (RSV, FLU A&B, COVID)  RVPGX2
Influenza A by PCR: NEGATIVE
Influenza B by PCR: NEGATIVE
Resp Syncytial Virus by PCR: NEGATIVE
SARS Coronavirus 2 by RT PCR: NEGATIVE

## 2023-06-07 LAB — PATHOLOGIST SMEAR REVIEW

## 2023-06-07 LAB — LACTIC ACID, PLASMA: Lactic Acid, Venous: 1.4 mmol/L (ref 0.5–1.9)

## 2023-06-07 MED ORDER — ONDANSETRON HCL 4 MG/2ML IJ SOLN
4.0000 mg | Freq: Once | INTRAMUSCULAR | Status: AC
  Administered 2023-06-07: 4 mg via INTRAVENOUS
  Filled 2023-06-07: qty 2

## 2023-06-07 MED ORDER — SODIUM CHLORIDE 0.9 % IV SOLN
2.0000 g | Freq: Once | INTRAVENOUS | Status: AC
  Administered 2023-06-07: 2 g via INTRAVENOUS
  Filled 2023-06-07: qty 12.5

## 2023-06-07 MED ORDER — LACTATED RINGERS IV SOLN: INTRAVENOUS | Status: DC

## 2023-06-07 MED ORDER — VANCOMYCIN HCL IN DEXTROSE 1-5 GM/200ML-% IV SOLN
1000.0000 mg | Freq: Once | INTRAVENOUS | Status: AC
  Administered 2023-06-07: 1000 mg via INTRAVENOUS
  Filled 2023-06-07: qty 200

## 2023-06-07 MED ORDER — ACETAMINOPHEN 325 MG PO TABS
650.0000 mg | ORAL_TABLET | Freq: Once | ORAL | Status: AC
  Administered 2023-06-07: 650 mg via ORAL
  Filled 2023-06-07: qty 2

## 2023-06-07 MED ORDER — MORPHINE SULFATE (PF) 4 MG/ML IV SOLN
4.0000 mg | Freq: Once | INTRAVENOUS | Status: AC
  Administered 2023-06-07: 4 mg via INTRAVENOUS
  Filled 2023-06-07: qty 1

## 2023-06-07 MED ORDER — METRONIDAZOLE 500 MG/100ML IV SOLN
500.0000 mg | Freq: Once | INTRAVENOUS | Status: AC
  Administered 2023-06-07: 500 mg via INTRAVENOUS
  Filled 2023-06-07: qty 100

## 2023-06-07 MED ORDER — LACTATED RINGERS IV BOLUS (SEPSIS)
1000.0000 mL | Freq: Once | INTRAVENOUS | Status: AC
  Administered 2023-06-07: 1000 mL via INTRAVENOUS

## 2023-06-07 NOTE — ED Provider Notes (Signed)
 [place] EMERGENCY DEPARTMENT AT [facility]  Provider Note  CSN: [id number] Arrival date & time: 06/07/23 0210  History Chief Complaint  Patient presents with   Fever    Nicholas Dunn is a 23 y.o. male with recent diagnosis of osteosarcoma of L femur was admitted at [facility] 4/8-4/15 for C1 of MISER chemo regimen. Had been doing well after discharge with only some mild nausea. Was scheduled to go back later today for lab check and PORT placement but woke up during the night with fever at home. Has had some nasal congestion and nausea. No cough, SOB, diarrhea or dysuria. Wife reports she tried to call the on-call number and was advised to call back in the AM.    Home Medications Prior to Admission medications   Medication Sig Start Date End Date Taking? Authorizing Provider  acetaminophen  (TYLENOL ) 325 MG tablet Take 650 mg by mouth every 6 (six) hours as needed.    [provider]  famotidine  (PEPCID ) 20 MG tablet Take 1 tablet (20 mg total) by mouth daily. 10/05/22   Kingsley, Victoria K, DO  NON FORMULARY Throat spray    [provider]  ondansetron  (ZOFRAN -ODT) 4 MG disintegrating tablet Take 1 tablet (4 mg total) by mouth every 8 (eight) hours as needed for nausea or vomiting. 09/13/22   Lamptey, Donley Furth, MD  promethazine -dextromethorphan (PROMETHAZINE -DM) 6.25-15 MG/5ML syrup Take 5 mLs by mouth 4 (four) times daily as needed for cough. 09/13/22   Lamptey, Donley Furth, MD     Allergies    Patient has no known allergies.   Review of Systems   Review of Systems Please see HPI for pertinent positives and negatives  Physical Exam BP (!) 108/49   Pulse (!) 109   Temp (!) 100.6 F (38.1 C)   Resp 17   SpO2 95%   Physical Exam Vitals and nursing note reviewed.  Constitutional:      Appearance: He is ill-appearing.  HENT:     Head: Normocephalic and atraumatic.     Nose: Nose normal.     Mouth/Throat:     Mouth: Mucous membranes are moist.   Eyes:     Extraocular Movements: Extraocular movements intact.     Conjunctiva/sclera: Conjunctivae normal.  Cardiovascular:     Rate and Rhythm: Tachycardia present.  Pulmonary:     Effort: Pulmonary effort is normal.     Breath sounds: Normal breath sounds. No wheezing or rales.  Abdominal:     General: Abdomen is flat.     Palpations: Abdomen is soft.     Tenderness: There is no abdominal tenderness. There is no guarding.  Musculoskeletal:        General: No swelling or tenderness. Normal range of motion.     Cervical back: Neck supple.  Skin:    General: Skin is warm and dry.  Neurological:     General: No focal deficit present.     Mental Status: He is alert.  Psychiatric:        Mood and Affect: Mood normal.     ED Results / Procedures / Treatments   EKG EKG Interpretation Date/Time:  Tuesday June 07 2023 03:16:02 EDT Ventricular Rate:  111 PR Interval:  153 QRS Duration:  92 QT Interval:  301 QTC Calculation: 409 R Axis:   96  Text Interpretation: Sinus tachycardia Borderline right axis deviation No old tracing to compare Confirmed by Shawnee Dellen [phone]) on 06/07/2023 3:18:05 AM  Procedures .Critical Care  Performed by: Charmayne Cooper, MD Authorized by: Charmayne Cooper, MD   Critical care provider statement:    Critical care time (minutes):  60   Critical care time was exclusive of:  Separately billable procedures and treating other patients   Critical care was necessary to treat or prevent imminent or life-threatening deterioration of the following conditions:  Sepsis   Critical care was time spent personally by me on the following activities:  Development of treatment plan with patient or surrogate, discussions with consultants, evaluation of patient's response to treatment, examination of patient, ordering and review of laboratory studies, ordering and review of radiographic studies, ordering and performing treatments and interventions, pulse  oximetry, re-evaluation of patient's condition and review of old charts   Care discussed with: admitting provider     Medications Ordered in the ED Medications  lactated ringers  infusion ( Intravenous New Bag/Given 06/07/23 0250)  lactated ringers  bolus 1,000 mL (0 mLs Intravenous Stopped 06/07/23 0329)  ceFEPIme  (MAXIPIME ) 2 g in sodium chloride  0.9 % 100 mL IVPB (0 g Intravenous Stopped 06/07/23 0407)  metroNIDAZOLE  (FLAGYL ) IVPB 500 mg (0 mg Intravenous Stopped 06/07/23 0407)  vancomycin  (VANCOCIN ) IVPB 1000 mg/200 mL premix (0 mg Intravenous Stopped 06/07/23 0451)  acetaminophen  (TYLENOL ) tablet 650 mg (650 mg Oral Given 06/07/23 0302)  ondansetron  (ZOFRAN ) injection 4 mg (4 mg Intravenous Given 06/07/23 0302)  morphine  (PF) 4 MG/ML injection 4 mg (4 mg Intravenous Given 06/07/23 0417)    Initial Impression and Plan  Patient who is s/p first cycle of chemo for osteosarcoma here with fever and tachycardia, normotensive. Concern for sepsis, neutropenia. Will begin IVF, broad spectrum Abx and check labs/CXR. Anticipate admission/transfer.   ED Course   Clinical Course as of 06/07/23 0622  Tue Jun 07, 2023  0315 INR is normal.  [CS]  0341 CMP is unremarkable. Lactic acid is neg. Per lab tech, CBC with marked neutropenia, he is doing a manual diff now. Will discuss transfer with [facility].  [CS]  0352 Covid/Flu/RSV swab is neg.  [CS]  7829 Spoke with Dr. Coy Ditty, Hem/Onc at [facility], who will accept the patient.  [CS]  0415 CBC result reviewed, neutropenia confirmed. Other cell lines are adequate.  [CS]    Clinical Course User Index [CS] Charmayne Cooper, MD     MDM Rules/Calculators/A&P Medical Decision Making Problems Addressed: Neutropenic fever [facility]): acute illness or injury  Amount and/or Complexity of Data Reviewed Labs: ordered. Decision-making details documented in ED Course. Radiology: ordered and independent interpretation performed. Decision-making details documented in ED  Course.  Risk OTC drugs. Prescription drug management. Parenteral controlled substances. Decision regarding hospitalization.     Final Clinical Impression(s) / ED Diagnoses Final diagnoses:  Neutropenic fever [facility])    Rx / DC Orders ED Discharge Orders     None        Charmayne Cooper, MD 06/07/23 [phone]

## 2023-06-07 NOTE — ED Notes (Addendum)
 Patient accepted at [facility] by Dr. Willim Hart.  Cancer [facility], 7th floor, [facility], Room [phone]. Report # [phone], Charge # [phone].

## 2023-06-07 NOTE — Sepsis Progress Note (Signed)
 Elink monitoring for the code sepsis protocol.

## 2023-06-07 NOTE — ED Notes (Signed)
Report to [facility]

## 2023-06-07 NOTE — ED Triage Notes (Signed)
 Pt is currently being treated for osteosarcoma with last treatment x 1 week ago. Pt began having chills, fever, nausea and vomiting approx 30 mins prior to arrival. Pt also reports diffuse abdominal cramping, as well.

## 2023-06-12 LAB — CULTURE, BLOOD (ROUTINE X 2)
Culture: NO GROWTH
Culture: NO GROWTH

## 2023-07-09 ENCOUNTER — Emergency Department (HOSPITAL_BASED_OUTPATIENT_CLINIC_OR_DEPARTMENT_OTHER)

## 2023-07-09 ENCOUNTER — Other Ambulatory Visit: Payer: Self-pay

## 2023-07-09 ENCOUNTER — Emergency Department (HOSPITAL_BASED_OUTPATIENT_CLINIC_OR_DEPARTMENT_OTHER)
Admission: EM | Admit: 2023-07-09 | Discharge: 2023-07-10 | Disposition: A | Discharge status: Home / Self Care | Attending: Emergency Medicine | Admitting: Emergency Medicine

## 2023-07-09 DIAGNOSIS — C499 Malignant neoplasm of connective and soft tissue, unspecified: Secondary | ICD-10-CM | POA: Insufficient documentation

## 2023-07-09 DIAGNOSIS — R079 Chest pain, unspecified: Secondary | ICD-10-CM | POA: Diagnosis present

## 2023-07-09 DIAGNOSIS — R000 Tachycardia, unspecified: Secondary | ICD-10-CM | POA: Diagnosis not present

## 2023-07-09 DIAGNOSIS — D709 Neutropenia, unspecified: Secondary | ICD-10-CM

## 2023-07-09 HISTORY — DX: Malignant (primary) neoplasm, unspecified: C80.1

## 2023-07-09 MED ORDER — SODIUM CHLORIDE 0.9 % IV BOLUS
1000.0000 mL | Freq: Once | INTRAVENOUS | Status: AC
  Administered 2023-07-10: 1000 mL via INTRAVENOUS

## 2023-07-09 NOTE — ED Triage Notes (Signed)
 Pt POV reporting mid chest pain that began this evening, HR 150s at home. Active chemo treatment, osteosarcoma, last treatment last week, also being treated for cdiff.

## 2023-07-09 NOTE — ED Provider Notes (Signed)
 [place] EMERGENCY DEPARTMENT AT [facility] Provider Note   CSN: [id number] Arrival date & time: 07/09/23  2228     History {Add pertinent medical, surgical, social history, OB history to HPI:1} Chief Complaint  Patient presents with   Chest Pain    CORDARYL DECELLES is a 23 y.o. male.  HPI     This is a 22 year old male who presents with chest pain.  Patient with a history of sarcoma actively undergoing chemotherapy and currently also with C. difficile who presents with chest pain.  Reports anterior pressure-like chest pain that started this morning.  He has never had pain like this before.  No recent fevers or cough.  No known history of blood clots.  Home Medications Prior to Admission medications   Medication Sig Start Date End Date Taking? Authorizing Provider  acetaminophen  (TYLENOL ) 325 MG tablet Take 650 mg by mouth every 6 (six) hours as needed.    [provider]  famotidine  (PEPCID ) 20 MG tablet Take 1 tablet (20 mg total) by mouth daily. 10/05/22   Kingsley, Victoria K, DO  NON FORMULARY Throat spray    [provider]  ondansetron  (ZOFRAN -ODT) 4 MG disintegrating tablet Take 1 tablet (4 mg total) by mouth every 8 (eight) hours as needed for nausea or vomiting. 09/13/22   Lamptey, Donley Furth, MD  promethazine -dextromethorphan (PROMETHAZINE -DM) 6.25-15 MG/5ML syrup Take 5 mLs by mouth 4 (four) times daily as needed for cough. 09/13/22   Lamptey, Donley Furth, MD      Allergies    Patient has no known allergies.    Review of Systems   Review of Systems  Constitutional:  Negative for fever.  Respiratory:  Negative for shortness of breath.   Cardiovascular:  Positive for chest pain. Negative for leg swelling.  Gastrointestinal:  Positive for diarrhea. Negative for abdominal pain, nausea and vomiting.  All other systems reviewed and are negative.   Physical Exam Updated Vital Signs BP 97/72   Pulse (!) 143   Temp 97.9 F (36.6 C) (Oral)    Resp 18   Ht 1.88 m (6\' 2" )   Wt 59.4 kg   SpO2 100%   BMI 16.82 kg/m  Physical Exam Vitals and nursing note reviewed.  Constitutional:      Appearance: He is well-developed.  HENT:     Head: Normocephalic and atraumatic.     Comments: Alopecia Eyes:     Pupils: Pupils are equal, round, and reactive to light.  Cardiovascular:     Rate and Rhythm: Regular rhythm. Tachycardia present.     Heart sounds: Normal heart sounds. No murmur heard.    Comments: Port right upper chest Pulmonary:     Effort: Pulmonary effort is normal. No respiratory distress.     Breath sounds: Normal breath sounds. No wheezing.  Abdominal:     General: Bowel sounds are normal.     Palpations: Abdomen is soft.     Tenderness: There is no abdominal tenderness. There is no rebound.  Musculoskeletal:     Cervical back: Neck supple.     Right lower leg: No edema.     Left lower leg: No edema.  Lymphadenopathy:     Cervical: No cervical adenopathy.  Skin:    General: Skin is warm and dry.  Neurological:     Mental Status: He is alert and oriented to person, place, and time.  Psychiatric:        Mood and Affect: Mood normal.  ED Results / Procedures / Treatments   Labs (all labs ordered are listed, but only abnormal results are displayed) Labs Reviewed  BASIC METABOLIC PANEL WITH GFR  CBC  TROPONIN T, HIGH SENSITIVITY    EKG None  Radiology DG Chest Port 1 View Result Date: 07/09/2023 CLINICAL DATA:  Mid chest pain. EXAM: PORTABLE CHEST 1 VIEW COMPARISON:  June 07, 2023 FINDINGS: Since the prior study there is been interval placement of a right-sided venous Port-A-Cath. Its distal tip is seen at the junction of the superior vena cava and right atrium. The heart size and mediastinal contours are within normal limits. Both lungs are clear. The visualized skeletal structures are unremarkable. IMPRESSION: 1. Interval right-sided venous Port-A-Cath placement, as described above. 2. No acute  cardiopulmonary disease. Electronically Signed   By: Virgle Grime M.D.   On: 07/09/2023 23:19    Procedures Procedures  {Document cardiac monitor, telemetry assessment procedure when appropriate:1}  Medications Ordered in ED Medications  sodium chloride  0.9 % bolus 1,000 mL (has no administration in time range)    ED Course/ Medical Decision Making/ A&P   {   Click here for ABCD2, HEART and other calculatorsREFRESH Note before signing :1}                              Medical Decision Making Amount and/or Complexity of Data Reviewed Labs: ordered.   ***  {Document critical care time when appropriate:1} {Document review of labs and clinical decision tools ie heart score, Chads2Vasc2 etc:1}  {Document your independent review of radiology images, and any outside records:1} {Document your discussion with family members, caretakers, and with consultants:1} {Document social determinants of health affecting pt's care:1} {Document your decision making why or why not admission, treatments were needed:1} Final Clinical Impression(s) / ED Diagnoses Final diagnoses:  None    Rx / DC Orders ED Discharge Orders     None

## 2023-07-10 ENCOUNTER — Encounter (HOSPITAL_BASED_OUTPATIENT_CLINIC_OR_DEPARTMENT_OTHER): Payer: Self-pay | Admitting: Emergency Medicine

## 2023-07-10 ENCOUNTER — Emergency Department (HOSPITAL_BASED_OUTPATIENT_CLINIC_OR_DEPARTMENT_OTHER)

## 2023-07-10 ENCOUNTER — Emergency Department (HOSPITAL_COMMUNITY)

## 2023-07-10 LAB — CBC
HCT: 32.7 % — ABNORMAL LOW (ref 39.0–52.0)
Hemoglobin: 11.4 g/dL — ABNORMAL LOW (ref 13.0–17.0)
MCH: 28.9 pg (ref 26.0–34.0)
MCHC: 34.9 g/dL (ref 30.0–36.0)
MCV: 82.8 fL (ref 80.0–100.0)
Platelets: 141 10*3/uL — ABNORMAL LOW (ref 150–400)
RBC: 3.95 MIL/uL — ABNORMAL LOW (ref 4.22–5.81)
RDW: 13.4 % (ref 11.5–15.5)
WBC: 0.5 10*3/uL — CL (ref 4.0–10.5)
nRBC: 0 % (ref 0.0–0.2)

## 2023-07-10 LAB — URINALYSIS, ROUTINE W REFLEX MICROSCOPIC
Bilirubin Urine: NEGATIVE
Glucose, UA: NEGATIVE mg/dL
Hgb urine dipstick: NEGATIVE
Ketones, ur: NEGATIVE mg/dL
Leukocytes,Ua: NEGATIVE
Nitrite: NEGATIVE
Protein, ur: NEGATIVE mg/dL
Specific Gravity, Urine: >1.046 — ABNORMAL HIGH (ref 1.005–1.030)
pH: 6 (ref 5.0–8.0)

## 2023-07-10 LAB — BASIC METABOLIC PANEL WITH GFR
Anion gap: 12 (ref 5–15)
BUN: 8 mg/dL (ref 6–20)
CO2: 23 mmol/L (ref 22–32)
Calcium: 9.4 mg/dL (ref 8.9–10.3)
Chloride: 97 mmol/L — ABNORMAL LOW (ref 98–111)
Creatinine, Ser: 0.67 mg/dL (ref 0.61–1.24)
GFR, Estimated: >60 mL/min (ref >60)
Glucose, Bld: 112 mg/dL — ABNORMAL HIGH (ref 70–99)
Potassium: 3.6 mmol/L (ref 3.5–5.1)
Sodium: 133 mmol/L — ABNORMAL LOW (ref 135–145)

## 2023-07-10 LAB — TROPONIN I (HIGH SENSITIVITY)
Troponin I (High Sensitivity): 3 ng/L (ref <18)
Troponin I (High Sensitivity): 4 ng/L (ref <18)

## 2023-07-10 LAB — LACTIC ACID, PLASMA
Lactic Acid, Venous: 1.3 mmol/L (ref 0.5–1.9)
Lactic Acid, Venous: 0.9 mmol/L (ref 0.5–1.9)

## 2023-07-10 LAB — TROPONIN T, HIGH SENSITIVITY: Troponin T High Sensitivity: <15 ng/L (ref <19)

## 2023-07-10 MED ORDER — VANCOMYCIN HCL 1.25 G IV SOLR
1250.0000 mg | Freq: Once | INTRAVENOUS | Status: DC
  Filled 2023-07-10: qty 25

## 2023-07-10 MED ORDER — SODIUM CHLORIDE 0.9 % IV SOLN
2.0000 g | Freq: Once | INTRAVENOUS | Status: AC
  Administered 2023-07-10: 2 g via INTRAVENOUS
  Filled 2023-07-10: qty 12.5

## 2023-07-10 MED ORDER — ONDANSETRON HCL 4 MG/2ML IJ SOLN
4.0000 mg | Freq: Once | INTRAMUSCULAR | Status: AC
  Administered 2023-07-10: 4 mg via INTRAVENOUS
  Filled 2023-07-10: qty 2

## 2023-07-10 MED ORDER — VANCOMYCIN HCL IN DEXTROSE 1-5 GM/200ML-% IV SOLN: 1000.0000 mg | Freq: Once | INTRAVENOUS | Status: DC

## 2023-07-10 MED ORDER — IOHEXOL 350 MG/ML SOLN
75.0000 mL | Freq: Once | INTRAVENOUS | Status: AC | PRN
  Administered 2023-07-10: 170 mL via INTRAVENOUS

## 2023-07-10 MED ORDER — LACTATED RINGERS IV BOLUS
1000.0000 mL | Freq: Once | INTRAVENOUS | Status: AC
  Administered 2023-07-10: 1000 mL via INTRAVENOUS

## 2023-07-10 MED ORDER — IOHEXOL 350 MG/ML SOLN
50.0000 mL | Freq: Once | INTRAVENOUS | Status: AC | PRN
  Administered 2023-07-10: 50 mL via INTRAVENOUS

## 2023-07-10 MED ORDER — PROMETHAZINE HCL 25 MG/ML IJ SOLN
12.5000 mg | Freq: Once | INTRAMUSCULAR | Status: DC
  Filled 2023-07-10: qty 0.5

## 2023-07-10 MED ORDER — CEFEPIME HCL 2 G IV SOLR: 2.0000 g | Freq: Three times a day (TID) | INTRAVENOUS | Status: DC

## 2023-07-10 MED ORDER — VANCOMYCIN HCL 125 MG PO CAPS
125.0000 mg | ORAL_CAPSULE | Freq: Four times a day (QID) | ORAL | Status: DC
  Administered 2023-07-10: 125 mg via ORAL
  Filled 2023-07-10 (×4): qty 1

## 2023-07-10 MED ORDER — AMOXICILLIN 500 MG PO CAPS: 500.0000 mg | ORAL_CAPSULE | Freq: Three times a day (TID) | ORAL | 0 refills | Status: AC

## 2023-07-10 NOTE — Progress Notes (Signed)
 Patient was given 75cc of omni 350 per radiology protocol for CTA PE study. Upon injection of first bolus scan was trigger but the scanner stopped on its own. This CT tech reloaded another 75cc of omni 350 and rebolus'ed patient. This CT tech thought it was just a fluke with the table. Upon second injection CT scanner acted in the same fashion and did not scan upon trigger of bolus. CT took patient back to the ER and alerted Dr. Donita Furrow that CT scanner could possibly be malfunctioning and informed her to act as if it was down until we could figure out the problem. After Doing some Quality Control with the CT phantom and doing a test scan on a tub of contrast the scanner appeared to be working again. Consulted Radiologist [place] via verbal phone call about patient and contrast dose. He agreed we should attempt the scan again after this CT tech relayed the information to him. This CT tech then confirmed with Dr. Carylon Claude that she wanted to attempt scan again and she said yes. Upon injection of third bolus CT scanner displayed an Error stating there was an error in data transmission when bolus was triggered. This CT tech attempted to limit any more contrast to the patient stopping the injection around 20cc of contrast. Services have been called for CT and ER alerted again that CT scanner was down. Patient received a total of 170cc of omni 350 but no scan was able to be attained.

## 2023-07-10 NOTE — ED Provider Notes (Signed)
 Pt waiting for a bed at [facility].  He has not had any more fever since an isolated 100 degrees.  Vitals have been good.  He feels much better.  He does not want to stay anymore and wants to go home.  I called Dr. Laray Platt (oncology) back and she recommends amox for 5 days.  Pt is stable for d/c.  Return if worse.    Sueellen Emery, MD 07/10/23 1322

## 2023-07-10 NOTE — ED Provider Notes (Signed)
 Patient with known osteosarcoma active receiving chemotherapy as of 1 week ago with recent C. difficile infection presents with chest pain and shortness of breath.  Attempted CT PE study at outside facility was unsuccessful due to machine malfunction.  CT tech reports limited to IV contrast at 200 cc/day and patient was already received 170 cc of contrast.  Discussed with radiology Dr. Martin Slay.  Patient is young with normal kidney function and he feels reduced dose of contrast will be acceptable.  Discussed with patient there is increased risk to his kidneys giving increased dose of contrast.  However he is young with normal GFR.  He understands the risk of giving IV contrast can cause potential kidney failure.  Discussed with oncology at [facility] Dr. Laray Platt.  Patient is afebrile and has no infectious symptoms.  Would not recommend additional workup for neutropenia at this time.  She requested callback if he does have occult pneumonia or pulmonary embolism.  CT negative for pulmonary embolism or pneumonia.  Patient states he feels "like crap" he complains of nausea and coughing but no hypoxia or increased work of breathing.  Has developed a temperature of 100 degrees.  Concern for developing febrile neutropenia. Repeat troponin pending.  Will discussed with oncology team at [facility].  D/w Dr. Laray Platt.  Temperature 100 even.  Patient states he feels "like crap".  He is having nausea and vomiting and coughing but no hypoxia or increased work of breathing.  Heart rate has normalized. Oncology team agrees that is concerning for developing neutropenic fever.  They recommend starting cefepime  2 g every 8 hours.  They will accept patient in transfer but no bed available currently. Cultures will be sent.  Urinalysis is pending.  Repeat troponin is pending. Anticipate transfer to [facility] later today   Earma Gloss, MD 07/10/23 [phone]

## 2023-07-10 NOTE — ED Notes (Signed)
 Transfer of care report given to EMS at bedside.  Pt Dx, Hx, Sx, lines, labs discussed.  All questions asked were answered.

## 2023-07-10 NOTE — ED Provider Notes (Signed)
 Risks and benefits of additional contrast dose discussed with patient as well as Dr. Martin Slay of radiology.  Patient agrees to proceed.   Earma Gloss, MD 07/10/23 [phone]

## 2023-07-10 NOTE — ED Notes (Signed)
 Pt in bed, pt denies nausea, pt states that he would like to hold off on the phenergan  at this time.

## 2023-07-11 LAB — URINE CULTURE: Culture: NO GROWTH

## 2023-07-15 LAB — CULTURE, BLOOD (ROUTINE X 2)
Culture: NO GROWTH
Special Requests: ADEQUATE
Special Requests: ADEQUATE

## 2023-09-19 NOTE — Discharge Summary (Signed)
 Orthopedic Surgery Discharge Summary  Patient ID: Nicholas [facility] [id number] 23 y.o. [birth year]  Admit date: 09/16/2023 Admitting Physician: Glendia Ambrose Blush, MD Admission Diagnoses:  Osteosarcoma of left femur    (CMD) [C40.22] Osteosarcoma    (CMD) [C41.9]    Discharge date: 09/19/2023   Discharge Physician: Glendia Ambrose Blush, MD Discharge Diagnoses:  Principal Problem:   Osteosarcoma of left femur    (CMD) Active Problems:   Osteosarcoma    (CMD) Resolved Problems:   * No resolved hospital problems. *  Operative Procedures:   PSU: Lateral gastrocnemius flap to the left knee.  ORT: 1.  Radical resection of cancerous bone tumor of the left femur. 2.  Total knee replacement proximal femur replacement. 3.  Local advancement flaps equal to 60 square cm.  Indications for Operative Procedures: per operative reports:  PSU: Nicholas Dunn is a 23 year old man with a history of left distal femoral osteosarcoma diagnosed by Dr. Blush who plans today for left distal femoral replacement. Shortly before the patient went back, Dr. Blush considered that there may be a role for Plastic Surgery for vascularized coverage of the construct given the lateral approach that he was going to use and the anticipated defect in the myofascial structure of the lateral knee and concern for implant exposure without additional vascularized coverage so Plastic Surgery was consulted shortly preoperatively.  ORT: Nicholas Dunn is a 23 year old male who is status post neoadjuvant chemotherapy for metastatic distal left femoral osteosarcoma. He has agreed to proceed with surgical removal of his tumor reconstruction with a prosthetic device.   Hospital Course:  23 y.o. male presented to the OR on the above named dates for the above named procedures.   Nicholas Dunn was able to tolerate the procedure without difficulty and was transferred to the Orthopedic surgery service for postoperative management, pain control,  therapy, and discharge planning.    The patient's pain was well controlled with the use of IV and oral formulations while in house.  On the day of discharge the patient is verbalizing adequate relief with the use of the prescribed oral regimen.  Therapy was initiated and the patient was able to meet all discharge therapy goals for a safe discharge home.  Nicholas Dunn is weight bearing as tolerated to his LEFT LOWER EXTREMITY in his fracture boot. The patient has been able to void without difficulty.  The patient has been able to tolerate their diet without complaints of nausea, vomiting, or diarrhea.  The patient is with AQUACEL dressings to their LEFT LOWER extremity that are clean, dry, and intact.  Nicholas Dunn is with a JP drain that is clean, intact, and with good suction. DVT prophylaxis initiated with ASA 81 mg twice per day.   Nicholas Dunn will be discharged with his JP drain intact. JP drain care was taught prior to discharge. He will [facility] as scheduled for JP drain removal and wound check.   Nicholas Dunn requested his OPPT be closer to his home in [place]. External referral placed for OPPT in [place]. They will contact him the date, time, and location of his OPPT.   At the time of discharge, the patient was afebrile, vital signs were stable, in no acute distress.  The patient meets all goals necessary for discharge from a medical, surgical, and therapy standpoint, and thus the patient is stable and safe for discharge.  New discharge medications discussed in detail and the patient stated understanding  of use and administration.  The patient is verbalizing understanding of all discharge instructions and therefore was released.     Medical Co-morbidities at Hospital Discharge: Durable Medical Equipment at hospital discharge:   [facility]  Surgical operations performed during hospitalization: Procedure(s) (LRB): Distal Femur Replacement  (Left) RECONSTRUCTION FLAP LOWER EXTREMITY (Left)   Consults:  [facility]et[facility] Pain Management   Disposition:  HOME  Patient Instructions:    Home Medications After Discharge  Scheduled . aspirin 81 mg chewable tablet, Chew 1 tablet (81 mg total) 2 (two) times a day. X 6 weeks, END DATE: 12/18/2023. . triamcinolone acetonide (KENALOG) 0.1 % cream, Apply topically 2 (two) times a day. . triamcinolone acetonide (KENALOG) 0.1 % paste, Apply to teeth 2 (two) times a day. . vancomycin  (VANCOCIN ) 125 mg capsule, Take 1 capsule (125 mg total) by mouth daily. While on chemotherapy  PRN . lidocaine  (XYLOCAINE ) 2 % viscous solution, Take 5 mL by mouth as needed. . methocarbamoL (ROBAXIN) 500 mg tablet, Take 1 tablet (500 mg total) by mouth 4 (four) times a day as needed for muscle spasms. . naloxone (Narcan) 4 mg/actuation spry nasal spray, Administer 1 spray into affected nostril(s) as needed for opioid reversal. Spray the contents of 1 device into 1 nostril. Call 911. May repeat with 2nd device in alternate nostril if no response in 2-3 minutes. . ondansetron  (ZOFRAN -ODT) 8 mg disintegrating tablet, Dissolve 1 tablet (8 mg total) on tongue every 8 (eight) hours as needed for nausea or vomiting. SABRA oxyCODONE (ROXICODONE) 5 mg immediate release tablet, Take 1-2 tablets (5-10 mg total) by mouth every 3 (three) hours as needed for moderate pain (4-6) or severe pain (7-10) (1 tablet:5 mg: mod pain, 2 tablets: 10 mg: severe pain). [facility] magic mouthwash-lidocaine  2% 1:1, 30 mL by mucous membrane route 4 (four) times a day as needed (swish and spit as needed for mouth ulcers/pain).         Medication List     START taking these medications    aspirin 81 mg chewable tablet Chew 1 tablet (81 mg total) 2 (two) times a day. X 6 weeks, END DATE: 12/18/2023.   methocarbamoL 500 mg tablet Commonly known as: ROBAXIN Take 1 tablet (500 mg total) by mouth 4 (four) times a day as needed for muscle spasms.    naloxone 4 mg/actuation Spry nasal spray Commonly known as: Narcan Administer 1 spray into affected nostril(s) as needed for opioid reversal. Spray the contents of 1 device into 1 nostril. Call 911. May repeat with 2nd device in alternate nostril if no response in 2-3 minutes.   oxyCODONE 5 mg immediate release tablet Commonly known as: ROXICODONE Take 1-2 tablets (5-10 mg total) by mouth every 3 (three) hours as needed for moderate pain (4-6) or severe pain (7-10) (1 tablet:5 mg: mod pain, 2 tablets: 10 mg: severe pain).       CONTINUE taking these medications    lidocaine  2 % viscous solution Commonly known as: XYLOCAINE  Take 5 mL by mouth as needed.   ondansetron  8 mg disintegrating tablet Commonly known as: ZOFRAN -ODT Dissolve 1 tablet (8 mg total) on tongue every 8 (eight) hours as needed for nausea or vomiting.   * triamcinolone acetonide 0.1 % paste Commonly known as: KENALOG Apply to teeth 2 (two) times a day.   * triamcinolone acetonide 0.1 % cream Commonly known as: KENALOG Apply topically 2 (two) times a day.   vancomycin  125 mg capsule Commonly known as: VANCOCIN  Take 1  capsule (125 mg total) by mouth daily. While on chemotherapy   [facility] magic mouthwash-lidocaine  2% 1:1 Swish and spit 30 mL 4 (four) times a day as needed for mouth ulcers/pain. (30 mL by mucous membrane route 4 (four) times a day as needed (swish and spit as needed for mouth ulcers/pain).)      * * This list has 2 medication(s) that are the same as other medications prescribed for you. Read the directions carefully, and ask your doctor or other care provider to review them with you.            Where to Get Your Medications     These medications were sent to [facility] [address] [place] [address]    Hours: Open Monday 12am to Friday 11:59pm; Sat-Sun: Closed; Holidays: Closed Thanksgiving Phone: [phone]  methocarbamoL 500 mg tablet naloxone 4  mg/actuation Spry nasal spray oxyCODONE 5 mg immediate release tablet    Information about where to get these medications is not yet available   Ask your nurse or doctor about these medications aspirin 81 mg chewable tablet      Activity: activity as tolerated and no heavy lifting, pushing, pulling with the implant side for 2 months Diet: regular diet Wound Care: keep his current aquacel dressings clean, dry, and intact. These dressings may be removed on September 26, 2023 and then daily dry dressing changes may begin. JP DRAIN is MANAGED BY PLASTIC SURGERY.  Weight Bearing Status: weight bearing as tolerated  DVT prophylaxis: ASA 81 mg twice per day x 6 weeks  [place]  Controlled Substance Reporting System was reviewed for Nicholas Dunn prior to providing Nicholas Bills Due with discharge narcotic prescription.   Please contact your doctor or seek medical assistance if you experience any of the following:  1. Fever greater than 101.5 F. 2. Decrease in urinary output. 3. Increased warmth, swelling, redness, or pain at your incision/wound site. 4. Increased drainage or bad odor at your incision/wound site.  5. Nausea/vomiting that does not stop.  6. Numbness, tingling, or discoloration of extremity.  7. Unable to drink fluids.  8. Uncontrollable pain.  9. Any other concerning symptoms.   Please, call 911 or go to your nearest emergency room if you experience any of the following:  1. Change in speech, vision, or ability to walk 2. Chest pain.  3. Difficulty breathing or shortness of breath.  Follow-up:   Scheduled Future Appointments       Provider Department Dept Phone Center   09/27/2023 10:00 AM Rockey Dallas Essex [facility]  - Plastic Surgery [phone] [facility] [place]   10/04/2023 10:30 AM Danielle Earnie Sharps [facility] - Orthopedics [facility] [phone] [facility] [place]   10/12/2023 12:30 PM HEM ONC 03 CATH CARE 2  [facility] [place] [facility] [place] [facility] Hematology Oncology [phone] [facility] Comp [place]   10/12/2023 1:00 PM Nat Massa [facility] [place] [facility] [place] [facility] Hematology Oncology [phone] [facility] Comp Can   10/12/2023 1:30 PM HEM ONC 03 ADMISSION [facility] [place] [facility] Hematology Oncology [phone] [facility] Comp Can   11/01/2023 2:30 PM Glendia Ambrose Blush [facility] [place] - Orthopedics [facility] [phone] [facility] Comp Can       Delon Mavis Brothers, NP  Time Spent on Discharge: 30 minutes  *Some images could not be shown.

## 2023-09-26 NOTE — Progress Notes (Signed)
 Cris is 11 days post-op: reconstruction flap lower extremity. Presenting for routine follow-up.    S:  Doing well. Patient has noted no excessive redness, swelling, pain, discharge, and disability. Patient denies significant pain. Path has not come back. Patient endorses right foot drop, numbness and pain of right heel and toes, and impaired balance. He has been wearing AFO and using tens unit. Wife reports about 10-15 ml drain output the past 3 days.  O:  Vitals:   09/27/23 1051  BP: 103/67  Pulse: 99  Temp: 98.1 F (36.7 C)  TempSrc: Temporal   Left lower: Flap intact. Sutures in place. Mild post op swelling, no erythema or indication of infection.  Right lower: Right foot drop, limited dorsiflexion. Positive Tinel sign over right proximal peroneal nerve.  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 Recommend ace wrap left leg for swelling. RTC 1 week for suture removal. He has a follow up in orthopedics next week. Discussed follow up in nerve clinic in 3 months for EMG if right lower leg and foot function has not improved.  Patient is to return as needed for redness, swelling, discomfort, or any concern about his  surgery.    This document serves as a record of services personally performed by K. Lang MD. It was created on their behalf by Logan CHARLENA everitt Ofelia, a trained medical scribe. The creation of this record is the providers dictation and/or activities during the visit. Sat 10/01/2023  Electronically signed by: Rockey Dallas Lang, MD 10/01/2023 10:11 AM

## 2023-09-30 NOTE — Progress Notes (Signed)
 Physical Therapy Visit - Daily Note   Payor: [place] [employer] / Plan: [place] [employer] / Product Type: Managed Medicaid /   Visit Count: 2   Rehabilitation Precautions/Restrictions:   Precautions/Restrictions Precautions: Prior history of cancer. Finished most recent round of chemo at the end of july. To begin next round first week of september Restrictions: Left LE Weightbearing in offloading boot only until otherwise cleared by surgeon        Referring Diagnosis: Osteosarcoma of left femur    (CMD) (C40.22)   SUBJECTIVE Patient Report:  Patient says his LT leg is doing well. His RT leg is hurting. He still has foot drop on the right side.  Change in Status Since Last Visit: No Pain:    RT leg 10/10; throbbing, pins in needles, stabbing; 2/10 LT leg; Performed treatment session to tolerance, monitored pain during session.   OBJECTIVE  General Observation/Objective Measures:         No measures taken today       Interventions:  Therapeutic exercise:  -quad set 20x 5 -SLR 2 x 10 each -heel slide x20 -heel raises 2 x 10  -lateral weight shifting 20 x 3 with BL UE support -FWD weight shifting 20 x 3  with BL UE support (difficult and unsteady)  -LAQ 20 x 3    Education: Yes, as described in interventions  Access Code: [email or web address] URL: [email or web address] Date: 09/30/2023 Prepared by: [facility]  Exercises - Active Straight Leg Raise with Quad Set  - 2-3 x daily - 7 x weekly - 2 sets - 10 reps - Heel Raises with Counter Support  - 2-3 x daily - 7 x weekly - 2 sets - 10 reps - Side to Side Weight Shift with Counter Support  - 2-3 x daily - 7 x weekly - 2 sets - 10 reps - 3 second hold - Staggered Stance Forward Backward Weight Shift with Counter Support  - 2-3 x daily - 7 x weekly - 2 sets - 10 reps - 3 second hold  ASSESSMENT Patient responded to session well. Progressed WB activity with added  standing weight shifting. Patient educated on proper form and function of added exercise. Patient requires increased verbal cues for appropriate UE support for safety during standing weight shifting. Patient remains limited by LE weakness and ROM restriction. Patient will continue to benefit from skilled therapy services to address impairments and improve LOF with ADLs.   Therapy Diagnosis:     ICD-10-CM   1. Post-operative state  Z98.890   2. Decreased strength of lower extremity  R29.898   3. Decreased range of motion (ROM) of left knee  M25.662   4. Impaired mobility  Z74.09     Progress Towards Goals:   on track   Goals Addressed             This Visit's Progress   . PT Goal       Short Term Goals (STG) - Time Frame visits 10 visits                STG 1: Patient will be independent with initial HEP in order to modulate symptoms and improve function  Baseline: Newly established    STG 2: Patient will demonstrated independent ambulation without the use of AD and with no observable gait deviations  Baseline: Ambulating with standard walker, left offloading boot donned, and right sided steppage gait   STG 3: Patient will demonstrate improved  Left knee AROM to >=90 degrees in order to improve patient performance of ADL's and self care task  Baseline: 45 degrees Left knee flexion    Long Term Goals (LTG) - Time Frame 20 visits                           LTG 1: Patient will be fully independent with advanced HEP in order to maximize function and reduce symptom occurrence  Baseline: Not yet established                LTG 2: Patient will demonstrate 4+/5 LE strength at bilateral Hip, Knee, and Ankle in order to improve patients functional mobility  Baseline:  KNEE out of 5 MMT Flexion    2/5 Left       4-/5 Right   Extension    1/5 Left       4-/5 Right     HIP Flexion    3-/5 Left     4-/5 Right    ANKLE Dorsiflexion    2+/5 Left Painful     0/5 Right   Plantarflexion    3-/5  Left  Painful   4-/5 Right   Eversion   2+/5 Left  Painful    0/5 Right  Inversion   2+/5 Left              4-/5 Right                 LTG 3: Patient will demonstrate Left Knee AROM WFL in order to improve patients performance with ADL's  Baseline: 45 degrees left knee AROM              LTG 4: Patient will have an improved LEFS score to >=70/100 in order to improved patient performance of functional mobility task such as squatting, walking, standing, and dressing/self care  Baseline: 22.5/100         PLAN Treatment Frequency and Duration:  Treatment Plan Details: 1-2x a week for up to 20 visits  Recommended PT Treatment/Interventions: ADL skills (97535); Electrical stimulation-attended (02967); Manual therapy (97140); Neuromuscular re-education [phone]); Therapeutic activity (97530); Vasopneumatic compression [phone]); Therapeutic exercise (97110); Self-care/home management [phone]); Iontophoresis [phone]); Caregiver Training (97550/97551/97552)   Recommended Consults:  None currently  Development of Plan of Care:  No change in POC.  Total Treatment Time (Time & Untimed): Total Treatment Time: 38 Total Time in Timed Codes: Time in Timed Codes: 38     Treatment/Procedures Therapeutic Exercises minutes: 38            The patient has been instructed to contact our clinic if any questions or problems should arise.

## 2023-10-03 NOTE — Progress Notes (Signed)
 Sarcoma Conference 10/04/23 :   HPI: 23 y.o. 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 Hospital doctor to ED March 2025 for flu like symptoms and xray left knee demonstrated concerning mass of left femur, referred to ortho onc. Patient was seen by Dr. Tanda and underwent excisional biopsy 04/29/2023, found to have high grade osteosarcoma. Patient received neoadjuvant chemotherapy prior to radical resection.  PMH:  None  Social: Works as an Haematologist, married  Exam: BMI 17.6, NVI, no TTP  Procedures: 04/29/2023: Excisional biopsy (Dr. Tanda) Path: High-grade sarcoma with chondroid features  09/16/2023: Radical resection left distal femur osteosarcoma, left distal femur replacement (Dr. Tanda); lateral gastrocnemius flap (Dr. Lang). 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 Lang)   10/04/2023 Ortho Onc Lorrayne Sharps) 10/12/2023 Heme Onc 11/01/2023 Ortho Onc (Dr. Tanda)   Treatment Plan Recommendations:  Adjuvant therapy planned but will need to be cleared from Ortho Onc for wound healing; follow-up scheduled for today.    Diagnoses and all orders for this visit: Osteosarcoma of left femur    (CMD)       Electronically signed by: Jameson Sherre Pollen, NP 10/04/2023 8:36 AM

## 2023-10-04 NOTE — Telephone Encounter (Signed)
 Patient requested to delay follow up by one week. Ok to delay appointment. Orders entered.  Scheduling: Please cancel appointments for 8/27 and schedule follow up on 9/3 with [place] PA-C or [employer]. Electronically signed by:  Eleanor Jenkins Pang, PA-C, 10/04/2023 12:28 PM

## 2023-10-04 NOTE — Progress Notes (Signed)
 Physical Therapy Visit - Daily Note   Payor: [place] [employer]ICA[employer] / Plan: [place] MEDICA[employer] / Product Type: Managed Medicaid /   Visit Count: 3   Rehabilitation Precautions/Restrictions:   Precautions/Restrictions Precautions: Prior history of cancer. Finished most recent round of chemo at the end of july. 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 He reports the he was informed to ween of rolling walker  Change in Status Since Last Visit: No Pain:  Pain Assessment Pain Assessment: 0-10 (Performed therapy to patient tolerance and monitered pain levels throughout the treatment session) Pain Score  : 3 Pain Location: Leg Pain Orientation: Left  RT leg 10/10; throbbing, pins in needles, stabbing; 3/10 LT leg; Performed treatment session to tolerance, monitored pain during session.   OBJECTIVE  General Observation/Objective Measures: - Patient arrives in NAD  - Ambulating with rolling walker and has discontinued use of L offloading boot          No measures taken today     Interventions:  Therapeutic exercise: 70' - quad set with heel propped on soft foam roll 20x 5 -heel slide x20 - Passive knee ROM with light overpressure at max flexion 1 x 5 for 30 sec holds - Education and perfomrance of LLLD stretching for L knee extension  - SAQ 1  x 10  - PROM of R ankle   Education: Yes, as described in interventions  Access Code: [email or web address] URL: [email or web address] Date: 09/30/2023 Prepared by: [facility]  Exercises - Active Straight Leg Raise with Quad Set  - 2-3 x daily - 7 x weekly - 2 sets - 10 reps - Heel Raises with Counter Support  - 2-3 x  daily - 7 x weekly - 2 sets - 10 reps - Side to Side Weight Shift with Counter Support  - 2-3 x daily - 7 x weekly - 2 sets - 10 reps - 3 second hold - Staggered Stance Forward Backward Weight Shift with Counter Support  - 2-3 x daily - 7 x weekly - 2 sets - 10 reps - 3 second hold  ASSESSMENT Patient responded to session well. Today interventions focused on restoration of L knee ROM. Patient educated on form and performance of LLLD knee extension stretch of the L knee. Patient was recently cleared from needing to use L offloading boot and encouraged to try and ween of walker. Plan to perform gait training with straight cane in further visits.  Patient will continue to benefit from skilled therapy services to address impairments and improve LOF with ADLs.   Therapy Diagnosis:     ICD-10-CM   1. Post-operative state  Z98.890   2. Decreased strength of lower extremity  R29.898   3. Decreased range of motion (ROM) of left knee  M25.662   4. Impaired mobility  Z74.09    Progress Towards Goals:   on track   Goals Addressed             This Visit's Progress   . PT Goal       Short Term Goals (STG) - Time Frame visits 10 visits  STG 1: Patient will be independent with initial HEP in order to modulate symptoms and improve function  Baseline: Newly established    STG 2: Patient will demonstrated independent ambulation without the use of AD and with no observable gait deviations  Baseline: Ambulating with standard walker, left offloading boot donned, and right sided steppage gait   STG 3: Patient will demonstrate improved Left knee AROM to >=90 degrees in order to improve patient performance of ADL's and self care task  Baseline: 45 degrees Left knee flexion    Long Term Goals (LTG) - Time Frame 20 visits                           LTG 1: Patient will be fully independent with advanced HEP in order to maximize function and reduce symptom occurrence  Baseline: Not yet  established                LTG 2: Patient will demonstrate 4+/5 LE strength at bilateral Hip, Knee, and Ankle in order to improve patients functional mobility  Baseline:  KNEE out of 5 MMT Flexion    2/5 Left       4-/5 Right   Extension    1/5 Left       4-/5 Right     HIP Flexion    3-/5 Left     4-/5 Right    ANKLE Dorsiflexion    2+/5 Left Painful     0/5 Right   Plantarflexion    3-/5 Left  Painful   4-/5 Right   Eversion   2+/5 Left  Painful    0/5 Right  Inversion   2+/5 Left              4-/5 Right                 LTG 3: Patient will demonstrate Left Knee AROM WFL in order to improve patients performance with ADL's  Baseline: 45 degrees left knee AROM              LTG 4: Patient will have an improved LEFS score to >=70/100 in order to improved patient performance of functional mobility task such as squatting, walking, standing, and dressing/self care  Baseline: 22.5/100        PLAN Treatment Frequency and Duration:  Treatment Plan Details: 1-2x a week for up to 20 visits  Recommended PT Treatment/Interventions: ADL skills (97535); Electrical stimulation-attended (02967); Manual therapy (97140); Neuromuscular re-education [phone]); Therapeutic activity (97530); Vasopneumatic compression [phone]); Therapeutic exercise (97110); Self-care/home management [phone]); Iontophoresis [phone]); Caregiver Training (97550/97551/97552)   Recommended Consults:  None currently  Development of Plan of Care:  No change in POC.  Total Treatment Time (Time & Untimed): Total Treatment Time: 38 Total Time in Timed Codes: Time in Timed Codes: 38     Treatment/Procedures Therapeutic Exercises minutes: 38          The patient has been instructed to contact our clinic if any questions or problems should arise.

## 2023-10-05 NOTE — Telephone Encounter (Signed)
 Please schedule on 9/3 at either 9 or double book at 1pm

## 2023-10-05 NOTE — Progress Notes (Signed)
 Physical Therapy Visit - Daily Note   Payor: [place] MEDICAID [employer] / Plan: [place] MEDICAID [employer]ITAS / Product Type: Managed Medicaid /   Visit Count: 4   Rehabilitation Precautions/Restrictions:   Precautions/Restrictions Precautions: Prior history of cancer. Finished most recent round of chemo at the end of july. 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 He would like to try NMES for RT ankle DF as discussed with previous therapist.  Change in Status Since Last Visit: No Pain:   5-6/10 LLE; RLE 9/10, tingling, throbbing pain; Performed treatment session to tolerance, monitored pain during session.    OBJECTIVE  General Observation/Objective Measures:         LT knee flexion AAROM 95 deg        Interventions:  Gait training:  -weight shifting front/ back; side to side 10 x 3 each  -gait training with RW -staggered stance balance 2 rounds 20 second   Neuromuscular re-education:  -NMES to RT tib anterior for muscle facilitation, 50% cycle, 10/10 sec on/off, 8 min total, 43 mA, with ankle DF AROM   Therapeutic exercise:  -heel slide 20 x 3 with strap  -SLR 2 x 10    Education: Yes, as described in interventions   ASSESSMENT Patient responded to session well. Trialed NMES for RT ankle DF with limited success. Patient tolerated well, but mostly demos ankle inversion compensation. Progressed WB activity and balance today. Patient educated on proper form and function of added exercise. Patient requires increased verbal and tactile cues for improved target muscle activity during NMES trail for RT ankle DF. Patient remains limited by LE weakness and decreased balance. Patient will continue to benefit from skilled  therapy services to address impairments and improve LOF with ADLs.    Therapy Diagnosis:     ICD-10-CM   1. Post-operative state  Z98.890   2. Decreased strength of lower extremity  R29.898   3. Decreased range of motion (ROM) of left knee  M25.662   4. Impaired mobility  Z74.09     Progress Towards Goals:   on track   Goals Addressed             This Visit's Progress   . PT Goal       Short Term Goals (STG) - Time Frame visits 10 visits                STG 1: Patient will be independent with initial HEP in order to modulate symptoms and improve function  Baseline: Newly established    STG 2: Patient will demonstrated independent ambulation without the use of AD and with no observable gait deviations  Baseline: Ambulating with standard walker, left offloading boot donned, and right sided steppage gait   STG 3: Patient will demonstrate improved Left knee AROM to >=90 degrees in order to improve patient performance of ADL's and self care task  Baseline: 45 degrees Left knee flexion    Long Term Goals (LTG) - Time Frame 20 visits                           LTG 1: Patient will be fully independent with advanced HEP in order to maximize  function and reduce symptom occurrence  Baseline: Not yet established                LTG 2: Patient will demonstrate 4+/5 LE strength at bilateral Hip, Knee, and Ankle in order to improve patients functional mobility  Baseline:  KNEE out of 5 MMT Flexion    2/5 Left       4-/5 Right   Extension    1/5 Left       4-/5 Right     HIP Flexion    3-/5 Left     4-/5 Right    ANKLE Dorsiflexion    2+/5 Left Painful     0/5 Right   Plantarflexion    3-/5 Left  Painful   4-/5 Right   Eversion   2+/5 Left  Painful    0/5 Right  Inversion   2+/5 Left              4-/5 Right                 LTG 3: Patient will demonstrate Left Knee AROM WFL in order to improve patients performance with ADL's  Baseline: 45 degrees left knee AROM              LTG 4:  Patient will have an improved LEFS score to >=70/100 in order to improved patient performance of functional mobility task such as squatting, walking, standing, and dressing/self care  Baseline: 22.5/100         PLAN Treatment Frequency and Duration:  Treatment Plan Details: 1-2x a week for up to 20 visits  Recommended PT Treatment/Interventions: ADL skills (97535); Electrical stimulation-attended (02967); Manual therapy (97140); Neuromuscular re-education [phone]); Therapeutic activity (97530); Vasopneumatic compression [phone]); Therapeutic exercise (97110); Self-care/home management [phone]); Iontophoresis [phone]); Caregiver Training (97550/97551/97552)   Recommended Consults:  None currently  Development of Plan of Care:  No change in POC.  Total Treatment Time (Time & Untimed): Total Treatment Time: 38 Total Time in Timed Codes: Time in Timed Codes: 38     Treatment/Procedures Gait Training minutes: 15 Neuromuscular Reeducation minutes: 8 Therapeutic Exercises minutes: 15            The patient has been instructed to contact our clinic if any questions or problems should arise.

## 2023-11-01 NOTE — Progress Notes (Signed)
 --15 minutes were spent during the present patient encounter. There were no encounter diagnoses.  --More than half of that time was spent in a face to face manner in the examination room, coordinating the patient's orthopedic care, and counseling and educating the patient and family.  L leg progressing well s/p DFR with PT showiong 0-110deg ROM but R peroneal neuropathy and foot drop no different to get custom AFO and neurological evaluation next month  --Together we reviewed the pertinent physical findings on today's exam: two shallow scabs otherwise good wound healing and good ROM  foot drop persists.  --Also we reviewed clinical information from radiographic studies: X-Ray: stable DFR well positioned  --We discussed the significance of the xray findings  --Based on this information I communicated my impression that he may continue WBAT  --Together we agreed to proceed with /fu with me same day as other appt in October for further clinical evaluation.   ----  Medical History[1]  Surgical History[2]  Medications Ordered Prior to Encounter[3]  Allergies[4]  Family History[5] Otherwise, no relevant orthopaedic family history.  ROS:  Complete review of systems performed, negative except that mentioned in the HPI  Electronically signed by:  Glendia Ambrose Blush, MD 11/01/2023 4:09 PM      [1] Past Medical History: Diagnosis Date  . Foot drop, right foot   . Osteosarcoma of left femur    (CMD)   [2] Past Surgical History: Procedure Laterality Date  . FEMUR BIOPSY Left 04/29/2023   BIOPSY/EXCISION FEMUR performed by Glendia Ambrose Blush, MD at [facility] OR  . KNEE ARTHROPLASTY Left 09/16/2023   Distal Femur Replacement performed by Glendia Ambrose Blush, MD at [facility] OR  . MUSCLE FLAP Left 09/16/2023   RECONSTRUCTION FLAP LOWER EXTREMITY performed by Rockey Dallas Essex, MD at [facility] OR  [3] Current Outpatient Medications on File Prior to Visit  Medication Sig Dispense Refill  .  lidocaine  (XYLOCAINE ) 2 % viscous solution Take 5 mL by mouth as needed.    . ondansetron  (ZOFRAN -ODT) 8 mg disintegrating tablet Dissolve 1 tablet (8 mg total) on tongue every 8 (eight) hours as needed for nausea or vomiting. 30 tablet 1  . oxyCODONE (ROXICODONE) 10 mg tab Take 0.5-1 tablets (5-10 mg total) by mouth every 3 (three) hours as needed for severe pain (7-10) or moderate pain (4-6) (Half tablet: 5 mg: mod pain, 1 tablet: 10 mg: sev pain.). 30 tablet 0  . pregabalin (LYRICA) 50 mg capsule Take 1 capsule (50 mg total) by mouth 3 (three) times a day for 7 days, THEN 2 capsules (100 mg total) 2 (two) times a day for 7 days. 49 capsule 0  . aspirin 81 mg chewable tablet Chew 1 tablet (81 mg total) 2 (two) times a day. X 6 weeks, END DATE: 12/18/2023. (Patient not taking: Reported on 11/01/2023)    . naloxone (Narcan) 4 mg/actuation spry nasal spray Administer 1 spray into affected nostril(s) as needed for opioid reversal. Spray the contents of 1 device into 1 nostril. Call 911. May repeat with 2nd device in alternate nostril if no response in 2-3 minutes. (Patient not taking: Reported on 11/01/2023) 2 each 0  . triamcinolone acetonide (KENALOG) 0.1 % cream Apply topically 2 (two) times a day. (Patient not taking: Reported on 11/01/2023)    . triamcinolone acetonide (KENALOG) 0.1 % paste Apply to teeth 2 (two) times a day. (Patient not taking: Reported on 11/01/2023)    . vancomycin  (VANCOCIN ) 125 mg capsule Take 1 capsule (125 mg  total) by mouth daily. While on chemotherapy (Patient not taking: Reported on 11/01/2023)    . [facility] magic mouthwash-lidocaine  2% 1:1 30 mL by mucous membrane route 4 (four) times a day as needed (swish and spit as needed for mouth ulcers/pain). (Patient not taking: Reported on 11/01/2023) 480 mL 2   No current facility-administered medications on file prior to visit.  [4] Allergies Allergen Reactions  . Aspirin Other (See Comments)    This medication is contraindicated  within 72 hours of high-dose methotrexate administration or until complete methotrexate elimination, whichever is longer.  . Cephalosporins Other (See Comments)    This medication is contraindicated within 72 hours of high-dose methotrexate administration or until complete methotrexate elimination, whichever is longer.   . Nsaids (Non-Steroidal Anti-Inflammatory Drug) Other (See Comments)    This medication is contraindicated within 72 hours of high-dose methotrexate administration or until complete methotrexate elimination, whichever is longer.  SABRA Penicillins Other (See Comments)    This medication is contraindicated within 72 hours of high-dose methotrexate administration or until complete methotrexate elimination, whichever is longer.   . Proton Pump Inhibitors   . Sulfa (Sulfonamide Antibiotics) Other (See Comments)    This medication is contraindicated within 72 hours of high-dose methotrexate administration or until complete methotrexate elimination, whichever is longer.   [5] Family History Problem Relation Name Age of Onset  . Pancreatic cancer Maternal Grandmother    . Bladder Cancer Maternal Great-Grandfather

## 2023-11-02 NOTE — Progress Notes (Signed)
 Physical Therapy Visit - Daily Note   Payor: [place] MEDICAID [employer]ITAS / Plan: [place] MEDICAID [employer] CARITAS / Product Type: Managed Medicaid /   Visit Count: 6   Rehabilitation Precautions/Restrictions:   Precautions/Restrictions Precautions: Prior history of cancer. Finished most recent round of chemo at the end of july. To begin next round first week of september Restrictions: Left LE Weightbearing in offloading boot only until otherwise cleared by surgeon        Referring Diagnosis: C40.22 - Osteosarcoma of left femur    (CMD)   SUBJECTIVE Patient Report:  Patient says he had recent MD follow up and he is currently in remission so he was able to come back to outpatient PT. He says his LT knee is doing good and his ROM is improving. He says his RT foot is worse, he still has foot drop and no active ankle DF. He also continues to have a lot of pain and sometimes some swelling in his RT foot. He is getting an AFO in October.   Change in Status Since Last Visit: No Pain:    9/10 RT foot pain, nerve, intense pain; Performed treatment session to tolerance, monitored pain during session.    OBJECTIVE  General Observation/Objective Measures:         No measures taken today       Interventions:  Therapeutic activity:  -sidestepping at mat x10  -stepping over hurdle RLE 6 inch hurdle x30    Therapeutic exercise: -rec bike 5 min dynamic warmup  -heel raises 2 x 10 -standing HS curl with 4lb AW x30 on RLE -supine HS curl with 4lb AW x30 BL -LAQ with 4lb AW x30 each -sidelying hip abduction with 4lb AW x30 each  -SLR with 4lb AW x30 each   Education: Yes, as described in interventions   ASSESSMENT Patient responded to session well. Progressed LE strengthening with added standing leg curl and weighted LAQ. Patient educated on proper form and function of added exercise. Patient requires increased verbal cues during standing leg curls for improved form to isolate  hamstring and avoid hip flexor compensation. Patient remains limited by LE weakness and ongoing gait deviations. Patient will continue to benefit from skilled therapy services to address impairments and improve LOF with ADLs.     Therapy Diagnosis:     ICD-10-CM   1. Post-operative state  Z98.890   2. Decreased strength of lower extremity  R29.898   3. Decreased range of motion (ROM) of left knee  M25.662   4. Impaired mobility  Z74.09     Progress Towards Goals:   on track  Goals Addressed             This Visit's Progress   . PT Goal       Short Term Goals (STG) - Time Frame visits 10 visits                STG 1: Patient will be independent with initial HEP in order to modulate symptoms and improve function  Baseline: Newly established    STG 2: Patient will demonstrated independent ambulation without the use of AD and with no observable gait deviations  Baseline: Ambulating with standard walker, left offloading boot donned, and right sided steppage gait   STG 3: Patient will demonstrate improved Left knee AROM to >=90 degrees in order to improve patient performance of ADL's and self care task  Baseline: 45 degrees Left knee flexion    Long Term Goals (LTG) -  Time Frame 20 visits                           LTG 1: Patient will be fully independent with advanced HEP in order to maximize function and reduce symptom occurrence  Baseline: Not yet established                LTG 2: Patient will demonstrate 4+/5 LE strength at bilateral Hip, Knee, and Ankle in order to improve patients functional mobility  Baseline:  KNEE out of 5 MMT Flexion    2/5 Left       4-/5 Right   Extension    1/5 Left       4-/5 Right     HIP Flexion    3-/5 Left     4-/5 Right    ANKLE Dorsiflexion    2+/5 Left Painful     0/5 Right   Plantarflexion    3-/5 Left  Painful   4-/5 Right   Eversion   2+/5 Left  Painful    0/5 Right  Inversion   2+/5 Left              4-/5 Right                 LTG  3: Patient will demonstrate Left Knee AROM WFL in order to improve patients performance with ADL's  Baseline: 45 degrees left knee AROM              LTG 4: Patient will have an improved LEFS score to >=70/100 in order to improved patient performance of functional mobility task such as squatting, walking, standing, and dressing/self care  Baseline: 22.5/100         PLAN Treatment Frequency and Duration:  Treatment Plan Details: 1-2x a week for up to 20 visits  Recommended PT Treatment/Interventions: ADL skills (97535); Electrical stimulation-attended (02967); Manual therapy (97140); Neuromuscular re-education [phone]); Therapeutic activity (97530); Vasopneumatic compression [phone]); Therapeutic exercise (97110); Self-care/home management [phone]); Iontophoresis [phone]); Caregiver Training (97550/97551/97552)   Recommended Consults:  None currently  Development of Plan of Care:  No change in POC.  Total Treatment Time (Time & Untimed): Total Treatment Time: 38 Total Time in Timed Codes: Time in Timed Codes: 38     Treatment/Procedures Therapeutic Actvity Direct Pt Contact minutes: 8 Therapeutic Exercises minutes: 30             The patient has been instructed to contact our clinic if any questions or problems should arise.

## 2023-11-09 NOTE — Progress Notes (Signed)
 Physical Therapy Visit - Daily Note   Payor: [place] [employer] / Plan: [place] [employer] / Product Type: Managed Medicaid /   Visit Count: 7   Rehabilitation Precautions/Restrictions:   Precautions/Restrictions Precautions: Prior history of cancer. Finished most recent round of chemo at the end of july. 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 It still aches when it is going to rain. His RT foot hurts about the same. He has been walking a little bit without his walker but sometimes it feels like his ankle is unsteady.   Change in Status Since Last Visit: No Pain:    7/10 RT foot, nerve pain; Performed treatment session to tolerance, monitored pain during session.   OBJECTIVE  General Observation/Objective Measures:         No measures taken today       Interventions:  Therapeutic activity:  -sidestepping at mat with 5lb AW x10  -sit to stand x5 -sit to stand from elevated low mat with RLE on foam pad 2 x 10    Therapeutic exercise: -rec bike 5 min dynamic warmup  -LAQ with 5lb AW x20 each -standing HS curl with 4lb AW x30 on RLE -supine HS curl with 4lb AW x30 BL  Gait:  -clinic amb with SPC 200 feet    Education: Yes, as described in interventions   ASSESSMENT Patient responded to session well. Progressed LE strengthening with increased ankle weights. Also progressed to gait with cane. Patient did well with this but demos ongoing RT foot drop. Patient remains limited by ongoing LE weakness. Patient will continue to benefit from skilled therapy services to address impairments and improve LOF with ADLs.   Therapy Diagnosis:     ICD-10-CM   1. Post-operative state  Z98.890   2. Decreased strength  of lower extremity  R29.898   3. Decreased range of motion (ROM) of left knee  M25.662   4. Impaired mobility  Z74.09     Progress Towards Goals:   on track   Goals Addressed             This Visit's Progress   . PT Goal       Short Term Goals (STG) - Time Frame visits 10 visits                STG 1: Patient will be independent with initial HEP in order to modulate symptoms and improve function  Baseline: Newly established    STG 2: Patient will demonstrated independent ambulation without the use of AD and with no observable gait deviations  Baseline: Ambulating with standard walker, left offloading boot donned, and right sided steppage gait   STG 3: Patient will demonstrate improved Left knee AROM to >=90 degrees in order to improve patient performance of ADL's and self care task  Baseline: 45 degrees Left knee flexion    Long Term Goals (LTG) - Time Frame 20 visits                           LTG 1: Patient will be fully independent with advanced HEP in order to maximize function and reduce symptom occurrence  Baseline: Not yet established  LTG 2: Patient will demonstrate 4+/5 LE strength at bilateral Hip, Knee, and Ankle in order to improve patients functional mobility  Baseline:  KNEE out of 5 MMT Flexion    2/5 Left       4-/5 Right   Extension    1/5 Left       4-/5 Right     HIP Flexion    3-/5 Left     4-/5 Right    ANKLE Dorsiflexion    2+/5 Left Painful     0/5 Right   Plantarflexion    3-/5 Left  Painful   4-/5 Right   Eversion   2+/5 Left  Painful    0/5 Right  Inversion   2+/5 Left              4-/5 Right                 LTG 3: Patient will demonstrate Left Knee AROM WFL in order to improve patients performance with ADL's  Baseline: 45 degrees left knee AROM              LTG 4: Patient will have an improved LEFS score to >=70/100 in order to improved patient performance of functional mobility task such as squatting, walking, standing, and  dressing/self care  Baseline: 22.5/100         PLAN Treatment Frequency and Duration:  Treatment Plan Details: 1-2x a week for up to 20 visits  Recommended PT Treatment/Interventions: ADL skills (97535); Electrical stimulation-attended (02967); Manual therapy (97140); Neuromuscular re-education [phone]); Therapeutic activity (97530); Vasopneumatic compression [phone]); Therapeutic exercise (97110); Self-care/home management [phone]); Iontophoresis [phone]); Caregiver Training ([id number]/[id number]/97552)   Recommended Consults:  None currently  Development of Plan of Care:  No change in POC.  Total Treatment Time (Time & Untimed): Total Treatment Time: 38 Total Time in Timed Codes: Time in Timed Codes: 38     Treatment/Procedures Gait Training minutes: 8 Therapeutic Actvity Direct Pt Contact minutes: 10 Therapeutic Exercises minutes: 20             The patient has been instructed to contact our clinic if any questions or problems should arise.

## 2023-11-13 ENCOUNTER — Emergency Department (HOSPITAL_BASED_OUTPATIENT_CLINIC_OR_DEPARTMENT_OTHER)
Admission: EM | Admit: 2023-11-13 | Discharge: 2023-11-13 | Disposition: A | Discharge status: Home / Self Care | Attending: Emergency Medicine | Admitting: Emergency Medicine

## 2023-11-13 ENCOUNTER — Encounter (HOSPITAL_BASED_OUTPATIENT_CLINIC_OR_DEPARTMENT_OTHER): Payer: Self-pay | Admitting: Emergency Medicine

## 2023-11-13 ENCOUNTER — Other Ambulatory Visit: Payer: Self-pay

## 2023-11-13 DIAGNOSIS — M79671 Pain in right foot: Secondary | ICD-10-CM | POA: Insufficient documentation

## 2023-11-13 MED ORDER — KETOROLAC TROMETHAMINE 30 MG/ML IJ SOLN
30.0000 mg | Freq: Once | INTRAMUSCULAR | Status: AC
  Administered 2023-11-13: 30 mg via INTRAMUSCULAR
  Filled 2023-11-13: qty 1

## 2023-11-13 MED ORDER — DEXAMETHASONE SODIUM PHOSPHATE 10 MG/ML IJ SOLN
10.0000 mg | Freq: Once | INTRAMUSCULAR | Status: AC
  Administered 2023-11-13: 10 mg via INTRAMUSCULAR
  Filled 2023-11-13: qty 1

## 2023-11-13 MED ORDER — OXYCODONE-ACETAMINOPHEN 5-325 MG PO TABS: 1.0000 | ORAL_TABLET | Freq: Three times a day (TID) | ORAL | 0 refills | Status: AC | PRN

## 2023-11-13 NOTE — Discharge Instructions (Signed)
 Please: Follow-up with your orthopedic surgeon for your right foot pain tomorrow.

## 2023-11-13 NOTE — ED Triage Notes (Addendum)
 Pt c/o right foot pain and foot drop after having surgery on the left knee in August. Has received rx for gabapentin and pregabalin with no relief. Hx nerve damage with muscle atrophy after surgery for osteosarcoma. Right foot is discolored and cold, strong pedal pulse noted. Sensation intact. Pt is ambulatory with walker. Tachycardic in triage, HR 122. Pt has a port, last accessed early Sept after completing chemo.

## 2023-11-13 NOTE — ED Notes (Addendum)
 Reviewed D/C information with the patient, pt verbalized understanding. No additional concerns at this time.

## 2023-11-13 NOTE — ED Provider Notes (Signed)
 [place] EMERGENCY DEPARTMENT AT [facility] Provider Note   CSN: [id number] Arrival date & time: 11/13/23  1826     Patient presents with: Foot Pain   Nicholas Dunn is a 23 y.o. male presented to ED with right foot drop and pain.  Patient underwent [facility] in August for a distal left femoral osteosarcoma.  He says after waking up from the surgery he had developed a right foot drop and has a neuropathic pain in his right foot since then.  He has been on Lyrica and gabapentin but is still having significant pain despite that.  He walks with a walker and cannot put weight on his right foot.  He says that it is the weekend he was unable to reach anyone at the surgeons office and is needing better pain control.   HPI     Prior to Admission medications   Medication Sig Start Date End Date Taking? Authorizing Provider  oxyCODONE-acetaminophen  (PERCOCET/ROXICET) 5-325 MG tablet Take 1 tablet by mouth every 8 (eight) hours as needed for up to 12 doses for severe pain (pain score 7-10). 11/13/23  Yes Nashid Pellum, Donnice PARAS, MD  acetaminophen  (TYLENOL ) 325 MG tablet Take 650 mg by mouth every 6 (six) hours as needed.    [provider]  amoxicillin  (AMOXIL ) 500 MG capsule Take 1 capsule (500 mg total) by mouth 3 (three) times daily. 07/10/23   Haviland, Julie, MD  famotidine  (PEPCID ) 20 MG tablet Take 1 tablet (20 mg total) by mouth daily. 10/05/22   Kingsley, Victoria K, DO  NON FORMULARY Throat spray    [provider]  ondansetron  (ZOFRAN -ODT) 4 MG disintegrating tablet Take 1 tablet (4 mg total) by mouth every 8 (eight) hours as needed for nausea or vomiting. 09/13/22   Lamptey, Aleene KIDD, MD  promethazine -dextromethorphan (PROMETHAZINE -DM) 6.25-15 MG/5ML syrup Take 5 mLs by mouth 4 (four) times daily as needed for cough. 09/13/22   Lamptey, Aleene KIDD, MD    Allergies: Patient has no known allergies.    Review of Systems  Updated Vital Signs BP  135/87   Pulse 100   Temp 98.5 F (36.9 C) (Oral)   Resp 16   Ht 6' (1.829 m)   Wt 61.2 kg   SpO2 99%   BMI 18.31 kg/m   Physical Exam Constitutional:      General: He is not in acute distress. HENT:     Head: Normocephalic and atraumatic.  Eyes:     Conjunctiva/sclera: Conjunctivae normal.     Pupils: Pupils are equal, round, and reactive to light.  Cardiovascular:     Rate and Rhythm: Normal rate and regular rhythm.  Pulmonary:     Effort: Pulmonary effort is normal. No respiratory distress.  Skin:    General: Skin is warm and dry.  Neurological:     General: No focal deficit present.     Mental Status: He is alert. Mental status is at baseline.     Comments: Right lower extremity foot drop  Psychiatric:        Mood and Affect: Mood normal.        Behavior: Behavior normal.     (all labs ordered are listed, but only abnormal results are displayed) Labs Reviewed - No data to display  EKG: None  Radiology: No results found.   Procedures   Medications Ordered in the ED  dexamethasone (DECADRON) injection 10 mg (has no administration in time range)  ketorolac (TORADOL) 30  MG/ML injection 30 mg (has no administration in time range)                                    Medical Decision Making Risk Prescription drug management.   Patient is here with complaint of neuropathic right foot pain and foot drop.  I reviewed his external records from [facility] and is as documented after his surgical procedure with physical therapy, back in August, with right foot drop and right foot pain.  I do not think that this is sepsis or ischemia.  He has an excellent pulse in the right foot.  The right foot is not discolored or cold, as reported in the triage note.  I do not see evidence of infection and doubt DVT.  I suspect this is neuropathic pain.  He is already on neuropathic agents.  We can try IM Toradol and Decadron here.  He is already using ibuprofen  at home.  I did  discuss and offered him a short course of Percocet for breakthrough pain until he is able to reach out to his surgeons office tomorrow.  He is comfortable with this plan and will pick it up from his pharmacy tomorrow.  He is stable for discharge     Final diagnoses:  Right foot pain    ED Discharge Orders          Ordered    oxyCODONE-acetaminophen  (PERCOCET/ROXICET) 5-325 MG tablet  Every 8 hours PRN        11/13/23 2001               Cottie Donnice PARAS, MD 11/13/23 2010
